# Patient Record
Sex: Female | Born: 1950
Health system: Southern US, Community
[De-identification: ages and names within clinical notes are randomized; demographics above are authoritative.]

## PROBLEM LIST (undated history)

## (undated) DIAGNOSIS — F259 Schizoaffective disorder, unspecified: Secondary | ICD-10-CM

## (undated) DIAGNOSIS — F22 Delusional disorders: Secondary | ICD-10-CM

## (undated) DIAGNOSIS — F29 Unspecified psychosis not due to a substance or known physiological condition: Secondary | ICD-10-CM

## (undated) DIAGNOSIS — Z9114 Patient's other noncompliance with medication regimen: Secondary | ICD-10-CM

## (undated) DIAGNOSIS — K219 Gastro-esophageal reflux disease without esophagitis: Secondary | ICD-10-CM

## (undated) DIAGNOSIS — I10 Essential (primary) hypertension: Secondary | ICD-10-CM

## (undated) DIAGNOSIS — F419 Anxiety disorder, unspecified: Secondary | ICD-10-CM

## (undated) DIAGNOSIS — F4310 Post-traumatic stress disorder, unspecified: Secondary | ICD-10-CM

## (undated) HISTORY — PX: ABDOMINAL SURGERY: SHX537

## (undated) HISTORY — PX: BLADDER SURGERY: SHX569

## (undated) HISTORY — PX: EYE SURGERY: SHX253

---

## 2013-12-29 DIAGNOSIS — F29 Unspecified psychosis not due to a substance or known physiological condition: Secondary | ICD-10-CM | POA: Diagnosis not present

## 2013-12-29 DIAGNOSIS — F3164 Bipolar disorder, current episode mixed, severe, with psychotic features: Secondary | ICD-10-CM | POA: Diagnosis present

## 2013-12-29 DIAGNOSIS — I10 Essential (primary) hypertension: Secondary | ICD-10-CM | POA: Diagnosis not present

## 2013-12-29 DIAGNOSIS — F312 Bipolar disorder, current episode manic severe with psychotic features: Secondary | ICD-10-CM | POA: Diagnosis not present

## 2013-12-29 DIAGNOSIS — F3160 Bipolar disorder, current episode mixed, unspecified: Secondary | ICD-10-CM | POA: Diagnosis not present

## 2013-12-29 DIAGNOSIS — F22 Delusional disorders: Secondary | ICD-10-CM | POA: Diagnosis not present

## 2014-01-05 DIAGNOSIS — F29 Unspecified psychosis not due to a substance or known physiological condition: Secondary | ICD-10-CM | POA: Diagnosis not present

## 2014-01-06 DIAGNOSIS — Z9119 Patient's noncompliance with other medical treatment and regimen: Secondary | ICD-10-CM | POA: Diagnosis not present

## 2014-01-06 DIAGNOSIS — R45851 Suicidal ideations: Secondary | ICD-10-CM | POA: Diagnosis not present

## 2014-01-06 DIAGNOSIS — K219 Gastro-esophageal reflux disease without esophagitis: Secondary | ICD-10-CM | POA: Diagnosis not present

## 2014-01-06 DIAGNOSIS — M129 Arthropathy, unspecified: Secondary | ICD-10-CM | POA: Diagnosis not present

## 2014-01-06 DIAGNOSIS — F23 Brief psychotic disorder: Secondary | ICD-10-CM | POA: Diagnosis not present

## 2014-01-06 DIAGNOSIS — R9431 Abnormal electrocardiogram [ECG] [EKG]: Secondary | ICD-10-CM | POA: Diagnosis not present

## 2014-01-06 DIAGNOSIS — Z91199 Patient's noncompliance with other medical treatment and regimen due to unspecified reason: Secondary | ICD-10-CM | POA: Diagnosis not present

## 2014-01-06 DIAGNOSIS — I10 Essential (primary) hypertension: Secondary | ICD-10-CM | POA: Diagnosis not present

## 2014-01-06 DIAGNOSIS — F4310 Post-traumatic stress disorder, unspecified: Secondary | ICD-10-CM | POA: Diagnosis not present

## 2014-01-06 DIAGNOSIS — F39 Unspecified mood [affective] disorder: Secondary | ICD-10-CM | POA: Diagnosis not present

## 2014-01-06 DIAGNOSIS — F259 Schizoaffective disorder, unspecified: Secondary | ICD-10-CM | POA: Diagnosis not present

## 2014-01-06 DIAGNOSIS — Z5900 Homelessness unspecified: Secondary | ICD-10-CM | POA: Diagnosis not present

## 2014-01-06 DIAGNOSIS — F29 Unspecified psychosis not due to a substance or known physiological condition: Secondary | ICD-10-CM | POA: Diagnosis not present

## 2014-01-06 DIAGNOSIS — Z8673 Personal history of transient ischemic attack (TIA), and cerebral infarction without residual deficits: Secondary | ICD-10-CM | POA: Diagnosis not present

## 2014-01-06 DIAGNOSIS — R410 Disorientation, unspecified: Secondary | ICD-10-CM | POA: Diagnosis not present

## 2014-01-20 DIAGNOSIS — IMO0002 Reserved for concepts with insufficient information to code with codable children: Secondary | ICD-10-CM | POA: Diagnosis not present

## 2014-01-20 DIAGNOSIS — R109 Unspecified abdominal pain: Secondary | ICD-10-CM | POA: Diagnosis not present

## 2014-01-20 DIAGNOSIS — I10 Essential (primary) hypertension: Secondary | ICD-10-CM | POA: Diagnosis not present

## 2014-01-20 DIAGNOSIS — M1710 Unilateral primary osteoarthritis, unspecified knee: Secondary | ICD-10-CM | POA: Diagnosis not present

## 2014-01-20 DIAGNOSIS — F29 Unspecified psychosis not due to a substance or known physiological condition: Secondary | ICD-10-CM | POA: Diagnosis not present

## 2014-01-20 DIAGNOSIS — Z23 Encounter for immunization: Secondary | ICD-10-CM | POA: Diagnosis not present

## 2014-02-18 DIAGNOSIS — T50901A Poisoning by unspecified drugs, medicaments and biological substances, accidental (unintentional), initial encounter: Secondary | ICD-10-CM | POA: Diagnosis not present

## 2014-02-18 DIAGNOSIS — I10 Essential (primary) hypertension: Secondary | ICD-10-CM | POA: Diagnosis not present

## 2014-02-18 DIAGNOSIS — R259 Unspecified abnormal involuntary movements: Secondary | ICD-10-CM | POA: Diagnosis not present

## 2014-02-18 DIAGNOSIS — R51 Headache: Secondary | ICD-10-CM | POA: Diagnosis not present

## 2014-02-21 DIAGNOSIS — I10 Essential (primary) hypertension: Secondary | ICD-10-CM | POA: Diagnosis not present

## 2014-02-21 DIAGNOSIS — Z8249 Family history of ischemic heart disease and other diseases of the circulatory system: Secondary | ICD-10-CM | POA: Diagnosis not present

## 2014-02-21 DIAGNOSIS — F4310 Post-traumatic stress disorder, unspecified: Secondary | ICD-10-CM | POA: Diagnosis not present

## 2014-02-21 DIAGNOSIS — I498 Other specified cardiac arrhythmias: Secondary | ICD-10-CM | POA: Diagnosis not present

## 2014-02-21 DIAGNOSIS — F411 Generalized anxiety disorder: Secondary | ICD-10-CM | POA: Diagnosis present

## 2014-02-21 DIAGNOSIS — R42 Dizziness and giddiness: Secondary | ICD-10-CM | POA: Diagnosis not present

## 2014-02-21 DIAGNOSIS — Z8673 Personal history of transient ischemic attack (TIA), and cerebral infarction without residual deficits: Secondary | ICD-10-CM | POA: Diagnosis not present

## 2014-02-21 DIAGNOSIS — I499 Cardiac arrhythmia, unspecified: Secondary | ICD-10-CM | POA: Diagnosis not present

## 2014-02-21 DIAGNOSIS — Z9119 Patient's noncompliance with other medical treatment and regimen: Secondary | ICD-10-CM | POA: Diagnosis not present

## 2014-02-21 DIAGNOSIS — F329 Major depressive disorder, single episode, unspecified: Secondary | ICD-10-CM | POA: Diagnosis not present

## 2014-02-21 DIAGNOSIS — R64 Cachexia: Secondary | ICD-10-CM | POA: Diagnosis not present

## 2014-02-21 DIAGNOSIS — F259 Schizoaffective disorder, unspecified: Secondary | ICD-10-CM | POA: Diagnosis not present

## 2014-02-21 DIAGNOSIS — Z598 Other problems related to housing and economic circumstances: Secondary | ICD-10-CM | POA: Diagnosis not present

## 2014-02-21 DIAGNOSIS — F3289 Other specified depressive episodes: Secondary | ICD-10-CM | POA: Diagnosis not present

## 2014-02-21 DIAGNOSIS — G4700 Insomnia, unspecified: Secondary | ICD-10-CM | POA: Diagnosis present

## 2014-02-21 DIAGNOSIS — Z91199 Patient's noncompliance with other medical treatment and regimen due to unspecified reason: Secondary | ICD-10-CM | POA: Diagnosis not present

## 2014-02-21 DIAGNOSIS — T45511A Poisoning by anticoagulants, accidental (unintentional), initial encounter: Secondary | ICD-10-CM | POA: Diagnosis not present

## 2014-02-21 DIAGNOSIS — G459 Transient cerebral ischemic attack, unspecified: Secondary | ICD-10-CM | POA: Diagnosis not present

## 2014-02-21 DIAGNOSIS — Z888 Allergy status to other drugs, medicaments and biological substances status: Secondary | ICD-10-CM | POA: Diagnosis not present

## 2014-02-21 DIAGNOSIS — Z5900 Homelessness unspecified: Secondary | ICD-10-CM | POA: Diagnosis not present

## 2014-02-21 DIAGNOSIS — T50901A Poisoning by unspecified drugs, medicaments and biological substances, accidental (unintentional), initial encounter: Secondary | ICD-10-CM | POA: Diagnosis not present

## 2014-02-21 DIAGNOSIS — Z7901 Long term (current) use of anticoagulants: Secondary | ICD-10-CM | POA: Diagnosis not present

## 2014-02-21 DIAGNOSIS — Z5987 Material hardship: Secondary | ICD-10-CM | POA: Diagnosis not present

## 2014-02-21 DIAGNOSIS — K219 Gastro-esophageal reflux disease without esophagitis: Secondary | ICD-10-CM | POA: Diagnosis present

## 2014-02-21 DIAGNOSIS — R209 Unspecified disturbances of skin sensation: Secondary | ICD-10-CM | POA: Diagnosis present

## 2014-02-21 DIAGNOSIS — IMO0002 Reserved for concepts with insufficient information to code with codable children: Secondary | ICD-10-CM | POA: Diagnosis not present

## 2014-02-21 DIAGNOSIS — IMO0001 Reserved for inherently not codable concepts without codable children: Secondary | ICD-10-CM | POA: Diagnosis not present

## 2014-03-15 DIAGNOSIS — E162 Hypoglycemia, unspecified: Secondary | ICD-10-CM | POA: Diagnosis not present

## 2014-03-15 DIAGNOSIS — F29 Unspecified psychosis not due to a substance or known physiological condition: Secondary | ICD-10-CM | POA: Diagnosis not present

## 2014-03-15 DIAGNOSIS — I10 Essential (primary) hypertension: Secondary | ICD-10-CM | POA: Diagnosis not present

## 2014-03-17 DIAGNOSIS — I10 Essential (primary) hypertension: Secondary | ICD-10-CM | POA: Diagnosis not present

## 2014-03-17 DIAGNOSIS — Z5320 Procedure and treatment not carried out because of patient's decision for unspecified reasons: Secondary | ICD-10-CM | POA: Diagnosis not present

## 2014-03-17 DIAGNOSIS — R0789 Other chest pain: Secondary | ICD-10-CM | POA: Diagnosis not present

## 2014-03-20 DIAGNOSIS — K5900 Constipation, unspecified: Secondary | ICD-10-CM | POA: Diagnosis not present

## 2014-03-22 DIAGNOSIS — R42 Dizziness and giddiness: Secondary | ICD-10-CM | POA: Diagnosis not present

## 2014-03-22 DIAGNOSIS — M1990 Unspecified osteoarthritis, unspecified site: Secondary | ICD-10-CM | POA: Diagnosis not present

## 2014-03-22 DIAGNOSIS — F4310 Post-traumatic stress disorder, unspecified: Secondary | ICD-10-CM | POA: Diagnosis not present

## 2014-03-22 DIAGNOSIS — R5381 Other malaise: Secondary | ICD-10-CM | POA: Diagnosis not present

## 2014-03-22 DIAGNOSIS — E162 Hypoglycemia, unspecified: Secondary | ICD-10-CM | POA: Diagnosis not present

## 2014-03-22 DIAGNOSIS — R5383 Other fatigue: Secondary | ICD-10-CM | POA: Diagnosis not present

## 2014-03-22 DIAGNOSIS — Z5900 Homelessness unspecified: Secondary | ICD-10-CM | POA: Diagnosis not present

## 2014-03-22 DIAGNOSIS — Z01818 Encounter for other preprocedural examination: Secondary | ICD-10-CM | POA: Diagnosis not present

## 2014-03-22 DIAGNOSIS — F29 Unspecified psychosis not due to a substance or known physiological condition: Secondary | ICD-10-CM | POA: Diagnosis not present

## 2014-03-22 DIAGNOSIS — R4182 Altered mental status, unspecified: Secondary | ICD-10-CM | POA: Diagnosis not present

## 2014-03-23 DIAGNOSIS — E162 Hypoglycemia, unspecified: Secondary | ICD-10-CM | POA: Diagnosis not present

## 2014-03-28 DIAGNOSIS — R4181 Age-related cognitive decline: Secondary | ICD-10-CM | POA: Diagnosis not present

## 2014-03-28 DIAGNOSIS — F259 Schizoaffective disorder, unspecified: Secondary | ICD-10-CM | POA: Diagnosis not present

## 2014-03-28 DIAGNOSIS — G3184 Mild cognitive impairment, so stated: Secondary | ICD-10-CM | POA: Diagnosis not present

## 2014-04-05 DIAGNOSIS — H5200 Hypermetropia, unspecified eye: Secondary | ICD-10-CM | POA: Diagnosis not present

## 2014-04-05 DIAGNOSIS — H25019 Cortical age-related cataract, unspecified eye: Secondary | ICD-10-CM | POA: Diagnosis not present

## 2014-04-05 DIAGNOSIS — I10 Essential (primary) hypertension: Secondary | ICD-10-CM | POA: Diagnosis not present

## 2014-04-05 DIAGNOSIS — Z01818 Encounter for other preprocedural examination: Secondary | ICD-10-CM | POA: Diagnosis not present

## 2014-04-05 DIAGNOSIS — H524 Presbyopia: Secondary | ICD-10-CM | POA: Diagnosis not present

## 2014-04-05 DIAGNOSIS — K219 Gastro-esophageal reflux disease without esophagitis: Secondary | ICD-10-CM | POA: Diagnosis not present

## 2014-04-05 DIAGNOSIS — H2510 Age-related nuclear cataract, unspecified eye: Secondary | ICD-10-CM | POA: Diagnosis not present

## 2014-04-29 DIAGNOSIS — Z8719 Personal history of other diseases of the digestive system: Secondary | ICD-10-CM | POA: Diagnosis not present

## 2014-04-29 DIAGNOSIS — R109 Unspecified abdominal pain: Secondary | ICD-10-CM | POA: Diagnosis not present

## 2014-04-29 DIAGNOSIS — K5900 Constipation, unspecified: Secondary | ICD-10-CM | POA: Diagnosis not present

## 2014-05-19 DIAGNOSIS — H269 Unspecified cataract: Secondary | ICD-10-CM | POA: Diagnosis not present

## 2014-05-26 DIAGNOSIS — H269 Unspecified cataract: Secondary | ICD-10-CM | POA: Diagnosis not present

## 2014-05-26 DIAGNOSIS — H25019 Cortical age-related cataract, unspecified eye: Secondary | ICD-10-CM | POA: Diagnosis not present

## 2014-05-26 DIAGNOSIS — H2510 Age-related nuclear cataract, unspecified eye: Secondary | ICD-10-CM | POA: Diagnosis not present

## 2014-07-20 DIAGNOSIS — F22 Delusional disorders: Secondary | ICD-10-CM | POA: Diagnosis not present

## 2014-07-20 DIAGNOSIS — Z888 Allergy status to other drugs, medicaments and biological substances status: Secondary | ICD-10-CM | POA: Diagnosis not present

## 2014-07-20 DIAGNOSIS — F489 Nonpsychotic mental disorder, unspecified: Secondary | ICD-10-CM | POA: Diagnosis not present

## 2014-07-20 DIAGNOSIS — Z5900 Homelessness unspecified: Secondary | ICD-10-CM | POA: Diagnosis not present

## 2014-07-20 DIAGNOSIS — R259 Unspecified abnormal involuntary movements: Secondary | ICD-10-CM | POA: Diagnosis not present

## 2014-07-20 DIAGNOSIS — R4789 Other speech disturbances: Secondary | ICD-10-CM | POA: Diagnosis not present

## 2014-07-20 DIAGNOSIS — G8929 Other chronic pain: Secondary | ICD-10-CM | POA: Diagnosis not present

## 2014-07-20 DIAGNOSIS — E860 Dehydration: Secondary | ICD-10-CM | POA: Diagnosis not present

## 2014-07-20 DIAGNOSIS — R5381 Other malaise: Secondary | ICD-10-CM | POA: Diagnosis not present

## 2014-07-20 DIAGNOSIS — F39 Unspecified mood [affective] disorder: Secondary | ICD-10-CM | POA: Diagnosis not present

## 2014-07-20 DIAGNOSIS — R05 Cough: Secondary | ICD-10-CM | POA: Diagnosis not present

## 2014-07-20 DIAGNOSIS — F411 Generalized anxiety disorder: Secondary | ICD-10-CM | POA: Diagnosis not present

## 2014-07-20 DIAGNOSIS — E870 Hyperosmolality and hypernatremia: Secondary | ICD-10-CM | POA: Diagnosis not present

## 2014-07-20 DIAGNOSIS — R4182 Altered mental status, unspecified: Secondary | ICD-10-CM | POA: Diagnosis not present

## 2014-07-20 DIAGNOSIS — R059 Cough, unspecified: Secondary | ICD-10-CM | POA: Diagnosis not present

## 2014-07-20 DIAGNOSIS — F3289 Other specified depressive episodes: Secondary | ICD-10-CM | POA: Diagnosis present

## 2014-07-20 DIAGNOSIS — I10 Essential (primary) hypertension: Secondary | ICD-10-CM | POA: Diagnosis not present

## 2014-07-20 DIAGNOSIS — F329 Major depressive disorder, single episode, unspecified: Secondary | ICD-10-CM | POA: Diagnosis present

## 2014-07-20 DIAGNOSIS — I658 Occlusion and stenosis of other precerebral arteries: Secondary | ICD-10-CM | POA: Diagnosis not present

## 2014-07-20 DIAGNOSIS — F4310 Post-traumatic stress disorder, unspecified: Secondary | ICD-10-CM | POA: Diagnosis not present

## 2014-07-20 DIAGNOSIS — R5383 Other fatigue: Secondary | ICD-10-CM | POA: Diagnosis not present

## 2014-08-09 DIAGNOSIS — M1710 Unilateral primary osteoarthritis, unspecified knee: Secondary | ICD-10-CM | POA: Diagnosis not present

## 2014-08-09 DIAGNOSIS — K219 Gastro-esophageal reflux disease without esophagitis: Secondary | ICD-10-CM | POA: Diagnosis not present

## 2014-08-09 DIAGNOSIS — F411 Generalized anxiety disorder: Secondary | ICD-10-CM | POA: Diagnosis not present

## 2014-08-09 DIAGNOSIS — IMO0002 Reserved for concepts with insufficient information to code with codable children: Secondary | ICD-10-CM | POA: Diagnosis not present

## 2014-08-09 DIAGNOSIS — I10 Essential (primary) hypertension: Secondary | ICD-10-CM | POA: Diagnosis not present

## 2014-08-09 DIAGNOSIS — R82998 Other abnormal findings in urine: Secondary | ICD-10-CM | POA: Diagnosis not present

## 2014-08-13 DIAGNOSIS — E162 Hypoglycemia, unspecified: Secondary | ICD-10-CM | POA: Diagnosis not present

## 2014-08-13 DIAGNOSIS — R079 Chest pain, unspecified: Secondary | ICD-10-CM | POA: Diagnosis not present

## 2014-08-13 DIAGNOSIS — R071 Chest pain on breathing: Secondary | ICD-10-CM | POA: Diagnosis not present

## 2014-08-13 DIAGNOSIS — I10 Essential (primary) hypertension: Secondary | ICD-10-CM | POA: Diagnosis not present

## 2014-08-21 DIAGNOSIS — R4182 Altered mental status, unspecified: Secondary | ICD-10-CM | POA: Diagnosis not present

## 2014-08-21 DIAGNOSIS — F4310 Post-traumatic stress disorder, unspecified: Secondary | ICD-10-CM | POA: Diagnosis not present

## 2014-08-21 DIAGNOSIS — F259 Schizoaffective disorder, unspecified: Secondary | ICD-10-CM | POA: Diagnosis not present

## 2014-08-22 DIAGNOSIS — M1710 Unilateral primary osteoarthritis, unspecified knee: Secondary | ICD-10-CM | POA: Diagnosis not present

## 2014-08-22 DIAGNOSIS — F259 Schizoaffective disorder, unspecified: Secondary | ICD-10-CM | POA: Diagnosis not present

## 2014-08-22 DIAGNOSIS — K219 Gastro-esophageal reflux disease without esophagitis: Secondary | ICD-10-CM | POA: Diagnosis not present

## 2014-08-22 DIAGNOSIS — I10 Essential (primary) hypertension: Secondary | ICD-10-CM | POA: Diagnosis not present

## 2014-08-23 DIAGNOSIS — F259 Schizoaffective disorder, unspecified: Secondary | ICD-10-CM | POA: Diagnosis not present

## 2014-08-31 DIAGNOSIS — F259 Schizoaffective disorder, unspecified: Secondary | ICD-10-CM | POA: Diagnosis not present

## 2014-09-01 DIAGNOSIS — F259 Schizoaffective disorder, unspecified: Secondary | ICD-10-CM | POA: Diagnosis not present

## 2014-09-02 DIAGNOSIS — F259 Schizoaffective disorder, unspecified: Secondary | ICD-10-CM | POA: Diagnosis not present

## 2014-09-06 DIAGNOSIS — F259 Schizoaffective disorder, unspecified: Secondary | ICD-10-CM | POA: Diagnosis not present

## 2014-09-12 DIAGNOSIS — F259 Schizoaffective disorder, unspecified: Secondary | ICD-10-CM | POA: Diagnosis not present

## 2014-09-22 DIAGNOSIS — F259 Schizoaffective disorder, unspecified: Secondary | ICD-10-CM | POA: Diagnosis not present

## 2014-09-25 DIAGNOSIS — I10 Essential (primary) hypertension: Secondary | ICD-10-CM | POA: Diagnosis not present

## 2014-09-25 DIAGNOSIS — F259 Schizoaffective disorder, unspecified: Secondary | ICD-10-CM | POA: Diagnosis not present

## 2014-09-25 DIAGNOSIS — K219 Gastro-esophageal reflux disease without esophagitis: Secondary | ICD-10-CM | POA: Diagnosis not present

## 2014-12-05 ENCOUNTER — Emergency Department (HOSPITAL_COMMUNITY)
Admission: EM | Admit: 2014-12-05 | Discharge: 2014-12-05 | Disposition: A | Discharge status: Home / Self Care | Payer: Medicare Other | Attending: Emergency Medicine | Admitting: Emergency Medicine

## 2014-12-05 ENCOUNTER — Encounter (HOSPITAL_COMMUNITY): Payer: Self-pay | Admitting: *Deleted

## 2014-12-05 ENCOUNTER — Emergency Department (HOSPITAL_COMMUNITY): Payer: Medicare Other

## 2014-12-05 DIAGNOSIS — R0789 Other chest pain: Secondary | ICD-10-CM | POA: Diagnosis not present

## 2014-12-05 DIAGNOSIS — Z79899 Other long term (current) drug therapy: Secondary | ICD-10-CM | POA: Insufficient documentation

## 2014-12-05 DIAGNOSIS — R918 Other nonspecific abnormal finding of lung field: Secondary | ICD-10-CM | POA: Diagnosis not present

## 2014-12-05 DIAGNOSIS — Z87891 Personal history of nicotine dependence: Secondary | ICD-10-CM | POA: Insufficient documentation

## 2014-12-05 DIAGNOSIS — R079 Chest pain, unspecified: Secondary | ICD-10-CM | POA: Diagnosis present

## 2014-12-05 DIAGNOSIS — R0602 Shortness of breath: Secondary | ICD-10-CM | POA: Diagnosis not present

## 2014-12-05 DIAGNOSIS — I10 Essential (primary) hypertension: Secondary | ICD-10-CM | POA: Insufficient documentation

## 2014-12-05 DIAGNOSIS — R05 Cough: Secondary | ICD-10-CM | POA: Insufficient documentation

## 2014-12-05 DIAGNOSIS — R52 Pain, unspecified: Secondary | ICD-10-CM

## 2014-12-05 DIAGNOSIS — R059 Cough, unspecified: Secondary | ICD-10-CM

## 2014-12-05 HISTORY — DX: Essential (primary) hypertension: I10

## 2014-12-05 LAB — COMPREHENSIVE METABOLIC PANEL
ALT: 10 U/L (ref 0–35)
AST: 15 U/L (ref 0–37)
Albumin: 3.6 g/dL (ref 3.5–5.2)
Alkaline Phosphatase: 110 U/L (ref 39–117)
Anion gap: 13 (ref 5–15)
BILIRUBIN TOTAL: 0.3 mg/dL (ref 0.3–1.2)
BUN: 21 mg/dL (ref 6–23)
CALCIUM: 9.3 mg/dL (ref 8.4–10.5)
CHLORIDE: 100 meq/L (ref 96–112)
CO2: 24 meq/L (ref 19–32)
CREATININE: 0.89 mg/dL (ref 0.50–1.10)
GFR, EST AFRICAN AMERICAN: 79 mL/min — AB (ref >90)
GFR, EST NON AFRICAN AMERICAN: 68 mL/min — AB (ref >90)
GLUCOSE: 98 mg/dL (ref 70–99)
Potassium: 3.7 mEq/L (ref 3.7–5.3)
Sodium: 137 mEq/L (ref 137–147)
Total Protein: 6.9 g/dL (ref 6.0–8.3)

## 2014-12-05 LAB — CBC WITH DIFFERENTIAL/PLATELET
Basophils Absolute: 0 10*3/uL (ref 0.0–0.1)
Basophils Relative: 1 % (ref 0–1)
EOS PCT: 3 % (ref 0–5)
Eosinophils Absolute: 0.2 10*3/uL (ref 0.0–0.7)
HCT: 37.2 % (ref 36.0–46.0)
Hemoglobin: 12.2 g/dL (ref 12.0–15.0)
LYMPHS PCT: 37 % (ref 12–46)
Lymphs Abs: 2.2 10*3/uL (ref 0.7–4.0)
MCH: 26.9 pg (ref 26.0–34.0)
MCHC: 32.8 g/dL (ref 30.0–36.0)
MCV: 81.9 fL (ref 78.0–100.0)
MONO ABS: 0.6 10*3/uL (ref 0.1–1.0)
MONOS PCT: 10 % (ref 3–12)
Neutro Abs: 3 10*3/uL (ref 1.7–7.7)
Neutrophils Relative %: 49 % (ref 43–77)
Platelets: 279 10*3/uL (ref 150–400)
RBC: 4.54 MIL/uL (ref 3.87–5.11)
RDW: 13.8 % (ref 11.5–15.5)
WBC: 6.1 10*3/uL (ref 4.0–10.5)

## 2014-12-05 LAB — D-DIMER, QUANTITATIVE: D-Dimer, Quant: <0.27 ug/mL-FEU (ref 0.00–0.48)

## 2014-12-05 LAB — I-STAT TROPONIN, ED: TROPONIN I, POC: 0 ng/mL (ref 0.00–0.08)

## 2014-12-05 LAB — LIPASE, BLOOD: Lipase: 53 U/L (ref 11–59)

## 2014-12-05 MED ORDER — LORAZEPAM 2 MG/ML IJ SOLN
0.5000 mg | Freq: Once | INTRAMUSCULAR | Status: AC
  Administered 2014-12-05: 0.5 mg via INTRAVENOUS
  Filled 2014-12-05: qty 1

## 2014-12-05 MED ORDER — MORPHINE SULFATE 4 MG/ML IJ SOLN
4.0000 mg | Freq: Once | INTRAMUSCULAR | Status: AC
  Administered 2014-12-05: 4 mg via INTRAVENOUS
  Filled 2014-12-05: qty 1

## 2014-12-05 MED ORDER — NITROGLYCERIN 0.4 MG SL SUBL
0.4000 mg | SUBLINGUAL_TABLET | Freq: Once | SUBLINGUAL | Status: AC
  Administered 2014-12-05: 0.4 mg via SUBLINGUAL
  Filled 2014-12-05: qty 1

## 2014-12-05 MED ORDER — HYDROCODONE-ACETAMINOPHEN 5-325 MG PO TABS: 1.0000 | ORAL_TABLET | Freq: Four times a day (QID) | ORAL | Status: DC | PRN

## 2014-12-05 NOTE — ED Provider Notes (Signed)
CSN: [id number]     Arrival date & time 12/05/14  2113 History   First MD Initiated Contact with Patient 12/05/14 2134     Chief Complaint  Patient presents with  . Chest Pain     (Consider location/radiation/quality/duration/timing/severity/associated sxs/prior Treatment) Patient is a 63 y.o. female presenting with chest pain. The history is provided by the patient (the pt states she has had chest pain all day today.   the pain has been continuous).  Chest Pain Pain location:  Substernal area Pain quality: aching   Pain radiates to:  L arm Pain radiates to the back: no   Pain severity:  Moderate Onset quality:  Sudden Timing:  Constant Chronicity:  New Context: not breathing   Associated symptoms: no abdominal pain, no back pain, no cough, no fatigue and no headache     Past Medical History  Diagnosis Date  . Hypertension   . Hypoglycemia    Past Surgical History  Procedure Laterality Date  . Eye surgery    . Abdominal surgery      blockage  . Bladder surgery     No family history on file. History  Substance Use Topics  . Smoking status: Former Games developermoker  . Smokeless tobacco: Not on file  . Alcohol Use: No   OB History    No data available     Review of Systems  Constitutional: Negative for appetite change and fatigue.  HENT: Negative for congestion, ear discharge and sinus pressure.   Eyes: Negative for discharge.  Respiratory: Negative for cough.   Cardiovascular: Positive for chest pain.  Gastrointestinal: Negative for abdominal pain and diarrhea.  Genitourinary: Negative for frequency and hematuria.  Musculoskeletal: Negative for back pain.  Skin: Negative for rash.  Neurological: Negative for seizures and headaches.  Psychiatric/Behavioral: Negative for hallucinations.      Allergies  Benadryl; Haldol; Red dye; Yeast-related products; and Yellow dyes (non-tartrazine)  Home Medications   Prior to Admission medications   Medication Sig Start Date  End Date Taking? Authorizing Provider  acetaminophen (TYLENOL) 500 MG tablet Take 1,000 mg by mouth every 6 (six) hours as needed for headache (headache).   Yes Historical Provider, MD  aspirin-acetaminophen-caffeine (EXCEDRIN MIGRAINE) [id number] MG per tablet Take 1-2 tablets by mouth every 6 (six) hours as needed for headache (headache).   Yes Historical Provider, MD  Famotidine-Ca Carb-Mag Hydrox (ACID REDUCER + ANTACID PO) Take 1 capsule by mouth daily.   Yes Historical Provider, MD  HYDROcodone-acetaminophen (NORCO/VICODIN) 5-325 MG per tablet Take 1 tablet by mouth every 6 (six) hours as needed for moderate pain. 12/05/14   Benny LennertJoseph L Burrel Legrand, MD   BP 118/73 mmHg  Pulse 58  Temp(Src) 97.5 F (36.4 C) (Oral)  Resp 13  Ht 5\' 4"  (1.626 m)  Wt 120 lb (54.432 kg)  BMI 20.59 kg/m2  SpO2 93% Physical Exam  Constitutional: She is oriented to person, place, and time. She appears well-developed.  HENT:  Head: Normocephalic.  Eyes: Conjunctivae and EOM are normal. No scleral icterus.  Neck: Neck supple. No thyromegaly present.  Cardiovascular: Normal rate and regular rhythm.  Exam reveals no gallop and no friction rub.   No murmur heard. Pulmonary/Chest: No stridor. She has no wheezes. She has no rales. She exhibits no tenderness.  Abdominal: She exhibits no distension. There is no tenderness. There is no rebound.  Musculoskeletal: Normal range of motion. She exhibits no edema.  Lymphadenopathy:    She has no cervical adenopathy.  Neurological: She  is oriented to person, place, and time. She exhibits normal muscle tone. Coordination normal.  Skin: No rash noted. No erythema.  Psychiatric: She has a normal mood and affect. Her behavior is normal.    ED Course  Procedures (including critical care time) Labs Review Labs Reviewed  COMPREHENSIVE METABOLIC PANEL - Abnormal; Notable for the following:    GFR calc non Af Amer 68 (*)    GFR calc Af Amer 79 (*)    All other components within  normal limits  CBC WITH DIFFERENTIAL  LIPASE, BLOOD  D-DIMER, QUANTITATIVE  I-STAT TROPOININ, ED    Imaging Review Dg Chest 2 View  12/05/2014   CLINICAL DATA:  Abnormal portable chest radiograph question nipple shadow  EXAM: CHEST  2 VIEW  COMPARISON:  12/05/2014  FINDINGS: Nodular density at the RIGHT lung base on previous exam is no longer identified.  Is made represented nipple shadow.  On current upright PA view the nipple markers are projecting below the diaphragm.  Lungs remain clear.  Heart size normal.  IMPRESSION: Nipple shadow on previous exam without evidence of pulmonary nodule.  No acute abnormalities.   Electronically Signed   By: Ulyses SouthwardMark  Boles M.D.   On: 12/05/2014 22:59   Dg Chest Portable 1 View  12/05/2014   CLINICAL DATA:  Chest pain radiating to LEFT shoulder and arm for 24 hr, nausea, shortness of breath, history hypertension, former smoker, initial encounter  EXAM: PORTABLE CHEST - 1 VIEW  COMPARISON:  Portable exam 2158 hr without priors for comparison  FINDINGS: Numerous EKG leads project over chest.  Normal heart size, mediastinal contours, and pulmonary vascularity.  Lungs clear.  No pleural effusion or pneumothorax.  Questionable nodular density at lower lateral RIGHT chest versus nipple shadow.  No acute osseous findings.  IMPRESSION: Question RIGHT nipple shadow.  Followup upright PA chest radiograph with nipple markers recommended to exclude pulmonary nodule.   Electronically Signed   By: Ulyses SouthwardMark  Boles M.D.   On: 12/05/2014 22:14     EKG Interpretation None     CRITICAL CARE  Date: 12/05/2014  Rate: 65  Rhythm: normal sinus rhythm  QRS Axis: normal  Intervals: normal  ST/T Wave abnormalities: normal  Conduction Disutrbances:none  Narrative Interpretation:   Old EKG Reviewed: none available     MDM   Final diagnoses:  Pain  Cough  Chest wall pain    Chest pain with normal studies.   Pt improved with tx will have pt follow up with pcp or [place] card        Benny LennertJoseph L Kiwana Deblasi, MD 12/05/14 2325

## 2014-12-05 NOTE — ED Notes (Signed)
Pt states that she began having left arm and left shoulder pain last night; pt states that it has progressed to chest pain and shortness of breath, nausea and diaphoresis

## 2014-12-05 NOTE — Discharge Instructions (Signed)
Follow up with your family md or [facility] next week

## 2015-01-08 ENCOUNTER — Emergency Department (HOSPITAL_COMMUNITY)
Admission: EM | Admit: 2015-01-08 | Discharge: 2015-01-08 | Discharge status: Against Medical Advice | Payer: Medicare Other | Attending: Emergency Medicine | Admitting: Emergency Medicine

## 2015-01-08 ENCOUNTER — Encounter (HOSPITAL_COMMUNITY): Payer: Self-pay | Admitting: Emergency Medicine

## 2015-01-08 DIAGNOSIS — Z87891 Personal history of nicotine dependence: Secondary | ICD-10-CM | POA: Diagnosis not present

## 2015-01-08 DIAGNOSIS — M25551 Pain in right hip: Secondary | ICD-10-CM | POA: Insufficient documentation

## 2015-01-08 DIAGNOSIS — I10 Essential (primary) hypertension: Secondary | ICD-10-CM | POA: Diagnosis not present

## 2015-01-08 NOTE — ED Notes (Signed)
Pt not in waiting room, called for patient 4 separate times over a period of 1 hour. Pt presumed to have left ED.

## 2015-01-08 NOTE — ED Notes (Signed)
Pt c/o right hip pain onset Thursday, no injury that she knows of. Pt here with other patient by that last name.

## 2015-01-13 ENCOUNTER — Emergency Department (HOSPITAL_COMMUNITY)
Admission: EM | Admit: 2015-01-13 | Discharge: 2015-01-13 | Disposition: A | Discharge status: Home / Self Care | Payer: Medicare Other | Attending: Emergency Medicine | Admitting: Emergency Medicine

## 2015-01-13 ENCOUNTER — Emergency Department (HOSPITAL_COMMUNITY): Payer: Medicare Other

## 2015-01-13 ENCOUNTER — Encounter (HOSPITAL_COMMUNITY): Payer: Self-pay | Admitting: Emergency Medicine

## 2015-01-13 DIAGNOSIS — S76011A Strain of muscle, fascia and tendon of right hip, initial encounter: Secondary | ICD-10-CM | POA: Insufficient documentation

## 2015-01-13 DIAGNOSIS — Z87891 Personal history of nicotine dependence: Secondary | ICD-10-CM | POA: Insufficient documentation

## 2015-01-13 DIAGNOSIS — Y998 Other external cause status: Secondary | ICD-10-CM | POA: Diagnosis not present

## 2015-01-13 DIAGNOSIS — Y9389 Activity, other specified: Secondary | ICD-10-CM | POA: Insufficient documentation

## 2015-01-13 DIAGNOSIS — Y92410 Unspecified street and highway as the place of occurrence of the external cause: Secondary | ICD-10-CM | POA: Insufficient documentation

## 2015-01-13 DIAGNOSIS — R102 Pelvic and perineal pain: Secondary | ICD-10-CM

## 2015-01-13 DIAGNOSIS — S39012A Strain of muscle, fascia and tendon of lower back, initial encounter: Secondary | ICD-10-CM | POA: Diagnosis not present

## 2015-01-13 DIAGNOSIS — S3992XA Unspecified injury of lower back, initial encounter: Secondary | ICD-10-CM | POA: Diagnosis not present

## 2015-01-13 DIAGNOSIS — S3993XA Unspecified injury of pelvis, initial encounter: Secondary | ICD-10-CM | POA: Insufficient documentation

## 2015-01-13 DIAGNOSIS — I10 Essential (primary) hypertension: Secondary | ICD-10-CM | POA: Diagnosis not present

## 2015-01-13 DIAGNOSIS — S86912A Strain of unspecified muscle(s) and tendon(s) at lower leg level, left leg, initial encounter: Secondary | ICD-10-CM

## 2015-01-13 DIAGNOSIS — S79911A Unspecified injury of right hip, initial encounter: Secondary | ICD-10-CM | POA: Diagnosis present

## 2015-01-13 NOTE — ED Notes (Signed)
Pt demanding to stay overnight in the lobby.  Pt explained that I would be glad to get some resources and try to find a ride there via bus pass.  Pt states that she and her significant other keep coming to the hospital because no one will let them stay.

## 2015-01-13 NOTE — ED Provider Notes (Signed)
CSN: [id number]     Arrival date & time 01/13/15  2146 History  This chart was scribed for non-physician practitioner working with No att. providers found, by Jarvis Morganaylor Ferguson, ED Scribe. This patient was seen in room WTR8/WTR8 and the patient's care was started at 10:08 PM.    Chief Complaint  Patient presents with  . Hip Pain    Patient is a 64 y.o. female presenting with hip pain. The history is provided by the patient. No language interpreter was used.  Hip Pain This is a new problem. Episode onset: 1 week. The problem occurs rarely. The problem has not changed since onset.Pertinent negatives include no abdominal pain. The symptoms are aggravated by exertion and walking. Nothing relieves the symptoms. She has tried nothing for the symptoms.    HPI Comments: Jennifer Leon is a 64 y.o. female with a h/o HTN and hypoglycemia who presents to the Emergency Department complaining of constant, moderate, right hip pain for 1 week. She states that 1 week ago she was on the bus and she was walking and felt like her right hip "gave out". She denies any fall. Pt notes that she recently found out that her legs are uneven. She notes that the pain is exacerbated by ambulation. She denies any abdominal pain or gait issue.   Past Medical History  Diagnosis Date  . Hypertension   . Hypoglycemia    Past Surgical History  Procedure Laterality Date  . Eye surgery    . Abdominal surgery      blockage  . Bladder surgery     No family history on file. History  Substance Use Topics  . Smoking status: Former Games developermoker  . Smokeless tobacco: Not on file  . Alcohol Use: No   OB History    No data available     Review of Systems  Gastrointestinal: Negative for abdominal pain.  Musculoskeletal: Positive for arthralgias (right hip). Negative for gait problem.      Allergies  Benadryl; Haldol; Red dye; Yeast-related products; and Yellow dyes (non-tartrazine)  Home Medications   Prior to Admission  medications   Medication Sig Start Date End Date Taking? Authorizing Provider  aspirin-acetaminophen-caffeine (EXCEDRIN MIGRAINE) [phone] MG per tablet Take 1-2 tablets by mouth every 6 (six) hours as needed for headache (headache).   Yes Historical Provider, MD  bismuth subsalicylate (PEPTO BISMOL) 262 MG/15ML suspension Take 30 mLs by mouth every 6 (six) hours as needed for indigestion.   Yes Historical Provider, MD  carboxymethylcellulose (REFRESH PLUS) 0.5 % SOLN Place 1 drop into both eyes 3 (three) times daily as needed.   Yes Historical Provider, MD  HYDROcodone-acetaminophen (NORCO/VICODIN) 5-325 MG per tablet Take 1 tablet by mouth every 6 (six) hours as needed for moderate pain. 12/05/14  Yes Benny LennertJoseph L Zammit, MD   Triage Vitals: BP 127/86 mmHg  Pulse 70  Temp(Src) 98.3 F (36.8 C) (Oral)  Resp 16  SpO2 100%  Physical Exam  Constitutional: She is oriented to person, place, and time. She appears well-developed and well-nourished. No distress.  HENT:  Head: Normocephalic and atraumatic.  Eyes: Conjunctivae and EOM are normal.  Neck: Neck supple. No tracheal deviation present.  Cardiovascular: Normal rate.   Pulmonary/Chest: Effort normal. No respiratory distress.  Musculoskeletal: Normal range of motion.  Minimal right paralumbar tenderness. Right hip full ROM w/o discomfort. Full strength of lower extremities. Mildly tender at anteromedial inguinal fold without swelling   Neurological: She is alert and oriented to person, place, and  time.  Skin: Skin is warm and dry.  Psychiatric: She has a normal mood and affect. Her behavior is normal.  Nursing note and vitals reviewed.   ED Course  Procedures (including critical care time)  DIAGNOSTIC STUDIES: Oxygen Saturation is 100% on RA, normal by my interpretation.    COORDINATION OF CARE: 10:24 PM- Will order x-ray of right hip. Pt advised of plan for treatment and pt agrees.     Labs Review Labs Reviewed - No data to  display  Imaging Review Dg Hip Unilat With Pelvis 2-3 Views Right  01/13/2015   CLINICAL DATA:  Constant moderate RIGHT pain for 1 week, air RIGHT hip gave out ; worse with ambulation.  EXAM: DG HIP W/ PELVIS 2-3V*R*  COMPARISON:  None.  FINDINGS: No acute fracture deformity nor dislocation. Sclerotic line at the RIGHT femoral head neck junction. No destructive bony lesions. Joint spaces intact. Subcentimeter possible loose body RIGHT femoral neck. Moderate degenerative sacroiliac joints. Soft tissue planes are nonsuspicious.  IMPRESSION: No acute fracture deformity or dislocation. No advanced degenerative change for age.  Sclerotic line at RIGHT femoral head neck junction could reflect stress injury.   Electronically Signed   By: Awilda Metro   On: 01/13/2015 23:31     EKG Interpretation None      MDM   Final diagnoses:  None    1. Right hip pain.  The patient is ambulatory, fully weight bearing. Suspect sprain/strain to hip/groin. Suggest ibuprofen, rest.   I personally performed the services described in this documentation, which was scribed in my presence. The recorded information has been reviewed and is accurate.     Arnoldo Hooker, PA-C 01/18/15 1610  Doug Sou, MD 01/18/15 1616

## 2015-01-13 NOTE — ED Notes (Signed)
Pt states her R hip "gives out" while she is ambulating and almost makes her fall. States she is able to catch herself. States she recently found out that her legs are uneven. Symptoms x one week. Pt ambulatory in triage with steady independent gait.

## 2015-01-13 NOTE — Discharge Instructions (Signed)

## 2015-05-10 DIAGNOSIS — F209 Schizophrenia, unspecified: Secondary | ICD-10-CM | POA: Diagnosis not present

## 2015-05-10 DIAGNOSIS — S0181XA Laceration without foreign body of other part of head, initial encounter: Secondary | ICD-10-CM | POA: Diagnosis not present

## 2015-05-10 DIAGNOSIS — F259 Schizoaffective disorder, unspecified: Secondary | ICD-10-CM | POA: Diagnosis not present

## 2015-07-12 DIAGNOSIS — M19042 Primary osteoarthritis, left hand: Secondary | ICD-10-CM | POA: Diagnosis not present

## 2015-07-12 DIAGNOSIS — M19041 Primary osteoarthritis, right hand: Secondary | ICD-10-CM | POA: Diagnosis not present

## 2015-07-12 DIAGNOSIS — I10 Essential (primary) hypertension: Secondary | ICD-10-CM | POA: Diagnosis not present

## 2015-07-12 DIAGNOSIS — F250 Schizoaffective disorder, bipolar type: Secondary | ICD-10-CM | POA: Diagnosis not present

## 2015-07-12 DIAGNOSIS — K219 Gastro-esophageal reflux disease without esophagitis: Secondary | ICD-10-CM | POA: Diagnosis not present

## 2015-07-12 DIAGNOSIS — M259 Joint disorder, unspecified: Secondary | ICD-10-CM | POA: Diagnosis not present

## 2015-07-17 DIAGNOSIS — F250 Schizoaffective disorder, bipolar type: Secondary | ICD-10-CM | POA: Diagnosis not present

## 2015-07-27 DIAGNOSIS — F250 Schizoaffective disorder, bipolar type: Secondary | ICD-10-CM | POA: Diagnosis not present

## 2015-07-31 DIAGNOSIS — F250 Schizoaffective disorder, bipolar type: Secondary | ICD-10-CM | POA: Diagnosis not present

## 2015-08-05 DIAGNOSIS — F250 Schizoaffective disorder, bipolar type: Secondary | ICD-10-CM | POA: Diagnosis not present

## 2015-08-06 DIAGNOSIS — F250 Schizoaffective disorder, bipolar type: Secondary | ICD-10-CM | POA: Diagnosis not present

## 2015-08-07 DIAGNOSIS — F250 Schizoaffective disorder, bipolar type: Secondary | ICD-10-CM | POA: Diagnosis not present

## 2015-08-13 DIAGNOSIS — F250 Schizoaffective disorder, bipolar type: Secondary | ICD-10-CM | POA: Diagnosis not present

## 2015-08-14 DIAGNOSIS — F250 Schizoaffective disorder, bipolar type: Secondary | ICD-10-CM | POA: Diagnosis not present

## 2015-08-15 DIAGNOSIS — F250 Schizoaffective disorder, bipolar type: Secondary | ICD-10-CM | POA: Diagnosis not present

## 2015-08-16 DIAGNOSIS — F250 Schizoaffective disorder, bipolar type: Secondary | ICD-10-CM | POA: Diagnosis not present

## 2015-08-24 DIAGNOSIS — F250 Schizoaffective disorder, bipolar type: Secondary | ICD-10-CM | POA: Diagnosis not present

## 2015-08-27 DIAGNOSIS — I10 Essential (primary) hypertension: Secondary | ICD-10-CM | POA: Diagnosis not present

## 2015-08-27 DIAGNOSIS — K219 Gastro-esophageal reflux disease without esophagitis: Secondary | ICD-10-CM | POA: Diagnosis not present

## 2015-08-27 DIAGNOSIS — M259 Joint disorder, unspecified: Secondary | ICD-10-CM | POA: Diagnosis not present

## 2015-08-30 DIAGNOSIS — F250 Schizoaffective disorder, bipolar type: Secondary | ICD-10-CM | POA: Diagnosis not present

## 2015-09-07 DIAGNOSIS — F250 Schizoaffective disorder, bipolar type: Secondary | ICD-10-CM | POA: Diagnosis not present

## 2015-09-11 DIAGNOSIS — F250 Schizoaffective disorder, bipolar type: Secondary | ICD-10-CM | POA: Diagnosis not present

## 2015-09-14 DIAGNOSIS — F250 Schizoaffective disorder, bipolar type: Secondary | ICD-10-CM | POA: Diagnosis not present

## 2015-09-18 DIAGNOSIS — F250 Schizoaffective disorder, bipolar type: Secondary | ICD-10-CM | POA: Diagnosis not present

## 2015-09-24 DIAGNOSIS — F250 Schizoaffective disorder, bipolar type: Secondary | ICD-10-CM | POA: Diagnosis not present

## 2015-09-27 DIAGNOSIS — F250 Schizoaffective disorder, bipolar type: Secondary | ICD-10-CM | POA: Diagnosis not present

## 2015-09-28 DIAGNOSIS — F250 Schizoaffective disorder, bipolar type: Secondary | ICD-10-CM | POA: Diagnosis not present

## 2015-09-28 DIAGNOSIS — K219 Gastro-esophageal reflux disease without esophagitis: Secondary | ICD-10-CM | POA: Diagnosis not present

## 2015-09-28 DIAGNOSIS — M19042 Primary osteoarthritis, left hand: Secondary | ICD-10-CM | POA: Diagnosis not present

## 2015-09-28 DIAGNOSIS — I10 Essential (primary) hypertension: Secondary | ICD-10-CM | POA: Diagnosis not present

## 2015-09-28 DIAGNOSIS — M19041 Primary osteoarthritis, right hand: Secondary | ICD-10-CM | POA: Diagnosis not present

## 2015-10-01 ENCOUNTER — Emergency Department (INDEPENDENT_AMBULATORY_CARE_PROVIDER_SITE_OTHER)
Admission: EM | Admit: 2015-10-01 | Discharge: 2015-10-01 | Disposition: A | Discharge status: Home / Self Care | Payer: Medicare Other | Source: Home / Self Care

## 2015-10-01 ENCOUNTER — Encounter (HOSPITAL_COMMUNITY): Payer: Self-pay | Admitting: Emergency Medicine

## 2015-10-01 DIAGNOSIS — Z9109 Other allergy status, other than to drugs and biological substances: Secondary | ICD-10-CM

## 2015-10-01 DIAGNOSIS — R509 Fever, unspecified: Secondary | ICD-10-CM

## 2015-10-01 DIAGNOSIS — R3915 Urgency of urination: Secondary | ICD-10-CM

## 2015-10-01 DIAGNOSIS — Z91048 Other nonmedicinal substance allergy status: Secondary | ICD-10-CM | POA: Diagnosis not present

## 2015-10-01 MED ORDER — FLUTICASONE PROPIONATE 50 MCG/ACT NA SUSP: 2.0000 | Freq: Every day | NASAL | Status: AC

## 2015-10-01 NOTE — ED Notes (Signed)
Upon entering room to collect urine specimen patient states that she is going to wait on that until she gets a PCP.  RN and NP notified order canceled

## 2015-10-01 NOTE — ED Provider Notes (Signed)
CSN: [id number]     Arrival date & time 10/01/15  1310 History   None    Chief Complaint  Patient presents with  . URI   (Consider location/radiation/quality/duration/timing/severity/associated sxs/prior Treatment) HPI Comments: Patient states she was at a medical facility where they had MRSA and she is worried she may have MRSA.  She is having some low grade fever on and off for 2 weeks now.  She has been having some sinus drainage for 2 days. She has a mild sore throat.  She is worried about possiblty having Addison's disease.  She is concerned she had some abnormal labs some time back and did not follow up.  She also states she has urinary urgency.  Patient is a 64 y.o. female presenting with URI. The history is provided by the patient.  URI Presenting symptoms: fever and sore throat   Severity:  Mild Onset quality:  Sudden Duration:  2 weeks Progression:  Waxing and waning Chronicity:  New Relieved by:  Rest Worsened by:  Nothing tried Ineffective treatments:  None tried Risk factors: being elderly and sick contacts     Past Medical History  Diagnosis Date  . Hypertension   . Hypoglycemia    Past Surgical History  Procedure Laterality Date  . Eye surgery    . Abdominal surgery      blockage  . Bladder surgery     History reviewed. No pertinent family history. Social History  Substance Use Topics  . Smoking status: Former Games developer  . Smokeless tobacco: None  . Alcohol Use: No   OB History    No data available     Review of Systems  Constitutional: Positive for fever.  HENT: Positive for sore throat.   Eyes: Negative.   Respiratory: Negative.   Cardiovascular: Negative.   Gastrointestinal: Negative.   Endocrine: Negative.   Genitourinary: Positive for urgency.  Musculoskeletal: Negative.   Allergic/Immunologic: Negative.   Neurological: Negative.   Hematological: Negative.   Psychiatric/Behavioral: Negative.     Allergies  Benadryl; Haldol; Red dye;  Yeast-related products; and Yellow dyes (non-tartrazine)  Home Medications   Prior to Admission medications   Medication Sig Start Date End Date Taking? Authorizing Provider  aspirin-acetaminophen-caffeine (EXCEDRIN MIGRAINE) [phone] MG per tablet Take 1-2 tablets by mouth every 6 (six) hours as needed for headache (headache).    Historical Provider, MD  bismuth subsalicylate (PEPTO BISMOL) 262 MG/15ML suspension Take 30 mLs by mouth every 6 (six) hours as needed for indigestion.    Historical Provider, MD  carboxymethylcellulose (REFRESH PLUS) 0.5 % SOLN Place 1 drop into both eyes 3 (three) times daily as needed.    Historical Provider, MD  HYDROcodone-acetaminophen (NORCO/VICODIN) 5-325 MG per tablet Take 1 tablet by mouth every 6 (six) hours as needed for moderate pain. 12/05/14   Bethann Berkshire, MD   Meds Ordered and Administered this Visit  Medications - No data to display  BP 153/89 mmHg  Pulse 71  Temp(Src) 97.7 F (36.5 C) (Oral)  SpO2 100% No data found.   Physical Exam  Constitutional: She is oriented to person, place, and time. She appears well-developed and well-nourished.  HENT:  Head: Normocephalic and atraumatic.  Eyes: Conjunctivae are normal. Pupils are equal, round, and reactive to light.  Neck: Normal range of motion. Neck supple.  Cardiovascular: Normal rate and regular rhythm.   Pulmonary/Chest: Effort normal and breath sounds normal.  Abdominal: Soft. Bowel sounds are normal.  Neurological: She is alert and oriented to person,  place, and time.    ED Course  Procedures (including critical care time)  Labs Review Labs Reviewed - No data to display  Imaging Review No results found.   Visual Acuity Review  Right Eye Distance:   Left Eye Distance:   Bilateral Distance:    Right Eye Near:   Left Eye Near:    Bilateral Near:         MDM  No diagnosis found.     Deatra Canter, FNP 10/01/15 1701  Deatra Canter, FNP 10/01/15  1729  Deatra Canter, FNP 10/01/15 1730

## 2015-10-01 NOTE — ED Notes (Signed)
Pt has several complaints today.  She recently returned from "up [place]" where she may have been exposed to several things, including MRSA.  Pt reports a low grade fever, sore throat, and nasal congestion.

## 2015-10-01 NOTE — Discharge Instructions (Signed)
Allergies °Allergies may happen from anything your body is sensitive to. This may be food, medicines, pollens, chemicals, and nearly anything around you in everyday life that produces allergens. An allergen is anything that causes an allergy producing substance. Heredity is often a factor in causing these problems. This means you may have some of the same allergies as your parents. °Food allergies happen in all age groups. Food allergies are some of the most severe and life threatening. Some common food allergies are cow's milk, seafood, eggs, nuts, wheat, and soybeans. °SYMPTOMS  °· Swelling around the mouth. °· An itchy red rash or hives. °· Vomiting or diarrhea. °· Difficulty breathing. °SEVERE ALLERGIC REACTIONS ARE LIFE-THREATENING. °This reaction is called anaphylaxis. It can cause the mouth and throat to swell and cause difficulty with breathing and swallowing. In severe reactions only a trace amount of food (for example, peanut oil in a salad) may cause death within seconds. °Seasonal allergies occur in all age groups. These are seasonal because they usually occur during the same season every year. They may be a reaction to molds, grass pollens, or tree pollens. Other causes of problems are house dust mite allergens, pet dander, and mold spores. The symptoms often consist of nasal congestion, a runny itchy nose associated with sneezing, and tearing itchy eyes. There is often an associated itching of the mouth and ears. The problems happen when you come in contact with pollens and other allergens. Allergens are the particles in the air that the body reacts to with an allergic reaction. This causes you to release allergic antibodies. Through a chain of events, these eventually cause you to release histamine into the blood stream. Although it is meant to be protective to the body, it is this release that causes your discomfort. This is why you were given anti-histamines to feel better.  If you are unable to  pinpoint the offending allergen, it may be determined by skin or blood testing. Allergies cannot be cured but can be controlled with medicine. °Hay fever is a collection of all or some of the seasonal allergy problems. It may often be treated with simple over-the-counter medicine such as diphenhydramine. Take medicine as directed. Do not drink alcohol or drive while taking this medicine. Check with your caregiver or package insert for child dosages. °If these medicines are not effective, there are many new medicines your caregiver can prescribe. Stronger medicine such as nasal spray, eye drops, and corticosteroids may be used if the first things you try do not work well. Other treatments such as immunotherapy or desensitizing injections can be used if all else fails. Follow up with your caregiver if problems continue. These seasonal allergies are usually not life threatening. They are generally more of a nuisance that can often be handled using medicine. °HOME CARE INSTRUCTIONS  °· If unsure what causes a reaction, keep a diary of foods eaten and symptoms that follow. Avoid foods that cause reactions. °· If hives or rash are present: °¨ Take medicine as directed. °¨ You may use an over-the-counter antihistamine (diphenhydramine) for hives and itching as needed. °¨ Apply cold compresses (cloths) to the skin or take baths in cool water. Avoid hot baths or showers. Heat will make a rash and itching worse. °· If you are severely allergic: °¨ Following a treatment for a severe reaction, hospitalization is often required for closer follow-up. °¨ Wear a medic-alert bracelet or necklace stating the allergy. °¨ You and your family must learn how to give adrenaline or use   a medic-alert bracelet or necklace stating the allergy.  ¨ You and your family must learn how to give adrenaline or use an anaphylaxis kit.  ¨ If you have had a severe reaction, always carry your anaphylaxis kit or EpiPen® with you. Use this medicine as directed by your caregiver if a severe reaction is occurring. Failure to do so could have a fatal outcome.  SEEK MEDICAL  CARE IF:  · You suspect a food allergy. Symptoms generally happen within 30 minutes of eating a food.  · Your symptoms have not gone away within 2 days or are getting worse.  · You develop new symptoms.  · You want to retest yourself or your child with a food or drink you think causes an allergic reaction. Never do this if an anaphylactic reaction to that food or drink has happened before. Only do this under the care of a caregiver.  SEEK IMMEDIATE MEDICAL CARE IF:   · You have difficulty breathing, are wheezing, or have a tight feeling in your chest or throat.  · You have a swollen mouth, or you have hives, swelling, or itching all over your body.  · You have had a severe reaction that has responded to your anaphylaxis kit or an EpiPen®. These reactions may return when the medicine has worn off. These reactions should be considered life threatening.  MAKE SURE YOU:   · Understand these instructions.  · Will watch your condition.  · Will get help right away if you are not doing well or get worse.  Document Released: 03/10/2003 Document Revised: 04/11/2013 Document Reviewed: 08/14/2008  ExitCare® Patient Information ©2015 [employer]. This information is not intended to replace advice given to you by your health care provider. Make sure you discuss any questions you have with your health care provider.  Allergies  Allergies may happen from anything your body is sensitive to. This may be food, medicines, pollens, chemicals, and nearly anything around you in everyday life that produces allergens. An allergen is anything that causes an allergy producing substance. Heredity is often a factor in causing these problems. This means you may have some of the same allergies as your parents.  Food allergies happen in all age groups. Food allergies are some of the most severe and life threatening. Some common food allergies are cow's milk, seafood, eggs, nuts, wheat, and soybeans.  SYMPTOMS   · Swelling around the mouth.  · An  itchy red rash or hives.  · Vomiting or diarrhea.  · Difficulty breathing.  SEVERE ALLERGIC REACTIONS ARE LIFE-THREATENING.  This reaction is called anaphylaxis. It can cause the mouth and throat to swell and cause difficulty with breathing and swallowing. In severe reactions only a trace amount of food (for example, peanut oil in a salad) may cause death within seconds.  Seasonal allergies occur in all age groups. These are seasonal because they usually occur during the same season every year. They may be a reaction to molds, grass pollens, or tree pollens. Other causes of problems are house dust mite allergens, pet dander, and mold spores. The symptoms often consist of nasal congestion, a runny itchy nose associated with sneezing, and tearing itchy eyes. There is often an associated itching of the mouth and ears. The problems happen when you come in contact with pollens and other allergens. Allergens are the particles in the air that the body reacts to with an allergic reaction. This causes you to release allergic antibodies. Through a chain of events, these   eventually cause you to release histamine into the blood stream. Although it is meant to be protective to the body, it is this release that causes your discomfort. This is why you were given anti-histamines to feel better.  If you are unable to pinpoint the offending allergen, it may be determined by skin or blood testing. Allergies cannot be cured but can be controlled with medicine.  Hay fever is a collection of all or some of the seasonal allergy problems. It may often be treated with simple over-the-counter medicine such as diphenhydramine. Take medicine as directed. Do not drink alcohol or drive while taking this medicine. Check with your caregiver or package insert for child dosages.  If these medicines are not effective, there are many new medicines your caregiver can prescribe. Stronger medicine such as nasal spray, eye drops, and corticosteroids may  be used if the first things you try do not work well. Other treatments such as immunotherapy or desensitizing injections can be used if all else fails. Follow up with your caregiver if problems continue. These seasonal allergies are usually not life threatening. They are generally more of a nuisance that can often be handled using medicine.  HOME CARE INSTRUCTIONS   · If unsure what causes a reaction, keep a diary of foods eaten and symptoms that follow. Avoid foods that cause reactions.  · If hives or rash are present:  ¨ Take medicine as directed.  ¨ You may use an over-the-counter antihistamine (diphenhydramine) for hives and itching as needed.  ¨ Apply cold compresses (cloths) to the skin or take baths in cool water. Avoid hot baths or showers. Heat will make a rash and itching worse.  · If you are severely allergic:  ¨ Following a treatment for a severe reaction, hospitalization is often required for closer follow-up.  ¨ Wear a medic-alert bracelet or necklace stating the allergy.  ¨ You and your family must learn how to give adrenaline or use an anaphylaxis kit.  ¨ If you have had a severe reaction, always carry your anaphylaxis kit or EpiPen® with you. Use this medicine as directed by your caregiver if a severe reaction is occurring. Failure to do so could have a fatal outcome.  SEEK MEDICAL CARE IF:  · You suspect a food allergy. Symptoms generally happen within 30 minutes of eating a food.  · Your symptoms have not gone away within 2 days or are getting worse.  · You develop new symptoms.  · You want to retest yourself or your child with a food or drink you think causes an allergic reaction. Never do this if an anaphylactic reaction to that food or drink has happened before. Only do this under the care of a caregiver.  SEEK IMMEDIATE MEDICAL CARE IF:   · You have difficulty breathing, are wheezing, or have a tight feeling in your chest or throat.  · You have a swollen mouth, or you have hives, swelling, or  itching all over your body.  · You have had a severe reaction that has responded to your anaphylaxis kit or an EpiPen®. These reactions may return when the medicine has worn off. These reactions should be considered life threatening.  MAKE SURE YOU:   · Understand these instructions.  · Will watch your condition.  · Will get help right away if you are not doing well or get worse.  Document Released: 03/10/2003 Document Revised: 04/11/2013 Document Reviewed: 08/14/2008  ExitCare® Patient Information ©2015 [employer]. This information is not intended

## 2015-11-12 ENCOUNTER — Encounter (HOSPITAL_COMMUNITY): Payer: Self-pay | Admitting: Family Medicine

## 2015-11-12 ENCOUNTER — Emergency Department (HOSPITAL_COMMUNITY): Payer: Medicare Other

## 2015-11-12 ENCOUNTER — Emergency Department (HOSPITAL_COMMUNITY)
Admission: EM | Admit: 2015-11-12 | Discharge: 2015-11-12 | Discharge status: Against Medical Advice | Payer: Medicare Other | Attending: Emergency Medicine | Admitting: Emergency Medicine

## 2015-11-12 DIAGNOSIS — M79622 Pain in left upper arm: Secondary | ICD-10-CM | POA: Insufficient documentation

## 2015-11-12 DIAGNOSIS — I10 Essential (primary) hypertension: Secondary | ICD-10-CM | POA: Diagnosis not present

## 2015-11-12 DIAGNOSIS — R61 Generalized hyperhidrosis: Secondary | ICD-10-CM | POA: Diagnosis not present

## 2015-11-12 DIAGNOSIS — R079 Chest pain, unspecified: Secondary | ICD-10-CM | POA: Diagnosis not present

## 2015-11-12 LAB — I-STAT CHEM 8, ED
BUN: 18 mg/dL (ref 6–20)
CALCIUM ION: 1.09 mmol/L — AB (ref 1.13–1.30)
CHLORIDE: 106 mmol/L (ref 101–111)
CREATININE: 0.9 mg/dL (ref 0.44–1.00)
Glucose, Bld: 92 mg/dL (ref 65–99)
HCT: 43 % (ref 36.0–46.0)
Hemoglobin: 14.6 g/dL (ref 12.0–15.0)
Potassium: 4 mmol/L (ref 3.5–5.1)
Sodium: 140 mmol/L (ref 135–145)
TCO2: 25 mmol/L (ref 0–100)

## 2015-11-12 LAB — CBC
HEMATOCRIT: 40.6 % (ref 36.0–46.0)
Hemoglobin: 13.6 g/dL (ref 12.0–15.0)
MCH: 27.6 pg (ref 26.0–34.0)
MCHC: 33.5 g/dL (ref 30.0–36.0)
MCV: 82.5 fL (ref 78.0–100.0)
PLATELETS: 234 10*3/uL (ref 150–400)
RBC: 4.92 MIL/uL (ref 3.87–5.11)
RDW: 13.7 % (ref 11.5–15.5)
WBC: 6.7 10*3/uL (ref 4.0–10.5)

## 2015-11-12 LAB — I-STAT TROPONIN, ED: Troponin i, poc: 0 ng/mL (ref 0.00–0.08)

## 2015-11-12 NOTE — ED Notes (Signed)
Pt here for chest heaviness, sweating and left arm pain and numbness. sts for a while. sts dizzy when standing up.

## 2015-12-04 DIAGNOSIS — H35361 Drusen (degenerative) of macula, right eye: Secondary | ICD-10-CM | POA: Diagnosis not present

## 2015-12-04 DIAGNOSIS — H2511 Age-related nuclear cataract, right eye: Secondary | ICD-10-CM | POA: Diagnosis not present

## 2015-12-19 DIAGNOSIS — R69 Illness, unspecified: Secondary | ICD-10-CM | POA: Diagnosis not present

## 2015-12-19 DIAGNOSIS — Z7689 Persons encountering health services in other specified circumstances: Secondary | ICD-10-CM | POA: Diagnosis not present

## 2015-12-20 DIAGNOSIS — Z743 Need for continuous supervision: Secondary | ICD-10-CM | POA: Diagnosis not present

## 2015-12-20 DIAGNOSIS — F250 Schizoaffective disorder, bipolar type: Secondary | ICD-10-CM | POA: Diagnosis not present

## 2015-12-20 DIAGNOSIS — F29 Unspecified psychosis not due to a substance or known physiological condition: Secondary | ICD-10-CM | POA: Diagnosis not present

## 2015-12-20 DIAGNOSIS — Z9119 Patient's noncompliance with other medical treatment and regimen: Secondary | ICD-10-CM | POA: Diagnosis not present

## 2015-12-20 DIAGNOSIS — R410 Disorientation, unspecified: Secondary | ICD-10-CM | POA: Diagnosis not present

## 2015-12-20 DIAGNOSIS — F22 Delusional disorders: Secondary | ICD-10-CM | POA: Diagnosis not present

## 2015-12-20 DIAGNOSIS — Z915 Personal history of self-harm: Secondary | ICD-10-CM | POA: Diagnosis not present

## 2016-01-13 ENCOUNTER — Encounter (HOSPITAL_COMMUNITY): Payer: Self-pay

## 2016-01-13 ENCOUNTER — Emergency Department (HOSPITAL_COMMUNITY)
Admission: EM | Admit: 2016-01-13 | Discharge: 2016-01-14 | Disposition: A | Discharge status: Other Acute Inpatient Hospital | Payer: Medicare Other | Attending: Emergency Medicine | Admitting: Emergency Medicine

## 2016-01-13 DIAGNOSIS — F29 Unspecified psychosis not due to a substance or known physiological condition: Secondary | ICD-10-CM | POA: Diagnosis not present

## 2016-01-13 DIAGNOSIS — Z046 Encounter for general psychiatric examination, requested by authority: Secondary | ICD-10-CM | POA: Diagnosis present

## 2016-01-13 DIAGNOSIS — Z7951 Long term (current) use of inhaled steroids: Secondary | ICD-10-CM | POA: Diagnosis not present

## 2016-01-13 DIAGNOSIS — F22 Delusional disorders: Secondary | ICD-10-CM | POA: Insufficient documentation

## 2016-01-13 DIAGNOSIS — Z9119 Patient's noncompliance with other medical treatment and regimen: Secondary | ICD-10-CM | POA: Insufficient documentation

## 2016-01-13 DIAGNOSIS — Z87891 Personal history of nicotine dependence: Secondary | ICD-10-CM | POA: Insufficient documentation

## 2016-01-13 DIAGNOSIS — Z8719 Personal history of other diseases of the digestive system: Secondary | ICD-10-CM | POA: Diagnosis not present

## 2016-01-13 DIAGNOSIS — I10 Essential (primary) hypertension: Secondary | ICD-10-CM | POA: Insufficient documentation

## 2016-01-13 HISTORY — DX: Anxiety disorder, unspecified: F41.9

## 2016-01-13 HISTORY — DX: Schizoaffective disorder, unspecified: F25.9

## 2016-01-13 HISTORY — DX: Gastro-esophageal reflux disease without esophagitis: K21.9

## 2016-01-13 HISTORY — DX: Unspecified psychosis not due to a substance or known physiological condition: F29

## 2016-01-13 HISTORY — DX: Post-traumatic stress disorder, unspecified: F43.10

## 2016-01-13 HISTORY — DX: Patient's other noncompliance with medication regimen: Z91.14

## 2016-01-13 HISTORY — DX: Delusional disorders: F22

## 2016-01-13 LAB — COMPREHENSIVE METABOLIC PANEL
ALK PHOS: 115 U/L (ref 38–126)
ALT: 13 U/L — AB (ref 14–54)
AST: 24 U/L (ref 15–41)
Albumin: 4.5 g/dL (ref 3.5–5.0)
Anion gap: 10 (ref 5–15)
BILIRUBIN TOTAL: 0.6 mg/dL (ref 0.3–1.2)
BUN: 16 mg/dL (ref 6–20)
CALCIUM: 9.3 mg/dL (ref 8.9–10.3)
CHLORIDE: 106 mmol/L (ref 101–111)
CO2: 24 mmol/L (ref 22–32)
CREATININE: 1.02 mg/dL — AB (ref 0.44–1.00)
GFR, EST NON AFRICAN AMERICAN: 57 mL/min — AB (ref >60)
Glucose, Bld: 178 mg/dL — ABNORMAL HIGH (ref 65–99)
Potassium: 3.4 mmol/L — ABNORMAL LOW (ref 3.5–5.1)
Sodium: 140 mmol/L (ref 135–145)
TOTAL PROTEIN: 7.8 g/dL (ref 6.5–8.1)

## 2016-01-13 LAB — CBC WITH DIFFERENTIAL/PLATELET
BASOS ABS: 0 10*3/uL (ref 0.0–0.1)
BASOS PCT: 0 %
EOS ABS: 0.1 10*3/uL (ref 0.0–0.7)
EOS PCT: 1 %
HCT: 40.5 % (ref 36.0–46.0)
HEMOGLOBIN: 13.4 g/dL (ref 12.0–15.0)
Lymphocytes Relative: 17 %
Lymphs Abs: 1.8 10*3/uL (ref 0.7–4.0)
MCH: 27 pg (ref 26.0–34.0)
MCHC: 33.1 g/dL (ref 30.0–36.0)
MCV: 81.7 fL (ref 78.0–100.0)
Monocytes Absolute: 0.9 10*3/uL (ref 0.1–1.0)
Monocytes Relative: 8 %
NEUTROS PCT: 74 %
Neutro Abs: 7.7 10*3/uL (ref 1.7–7.7)
PLATELETS: 292 10*3/uL (ref 150–400)
RBC: 4.96 MIL/uL (ref 3.87–5.11)
RDW: 13.6 % (ref 11.5–15.5)
WBC: 10.5 10*3/uL (ref 4.0–10.5)

## 2016-01-13 LAB — ETHANOL: Alcohol, Ethyl (B): <5 mg/dL

## 2016-01-13 MED ORDER — HYPROMELLOSE (GONIOSCOPIC) 2.5 % OP SOLN: 1.0000 [drp] | OPHTHALMIC | Status: DC | PRN

## 2016-01-13 MED ORDER — ACETAMINOPHEN 325 MG PO TABS: 650.0000 mg | ORAL_TABLET | ORAL | Status: DC | PRN

## 2016-01-13 MED ORDER — ASPIRIN EC 81 MG PO TBEC: 81.0000 mg | DELAYED_RELEASE_TABLET | Freq: Every day | ORAL | Status: DC | PRN

## 2016-01-13 MED ORDER — FLUTICASONE PROPIONATE 50 MCG/ACT NA SUSP
2.0000 | Freq: Every day | NASAL | Status: DC
  Filled 2016-01-13: qty 16

## 2016-01-13 MED ORDER — LORAZEPAM 1 MG PO TABS: 1.0000 mg | ORAL_TABLET | Freq: Three times a day (TID) | ORAL | Status: DC | PRN

## 2016-01-13 NOTE — ED Notes (Signed)
Patient presents with officer, [place] and [place] to which a bystander called the police stating concern for her safety because she was walking close to the highway. When police presented patient was irrational. Patient has flight of ideas, is delusional and paranoid about "Obama and the police", states she does have a mental health history but has "been cleared". Flight of ideas present in triage, pt angry and paranoid.
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
## 2016-01-13 NOTE — ED Provider Notes (Signed)
CSN: [id number]     Arrival date & time 01/13/16  2126 History   First MD Initiated Contact with Patient 01/13/16 2146     Chief Complaint  Patient presents with  . V70.1      The history is provided by the patient and the police. The history is limited by the condition of the patient (psychosis).  Pt was seen at 2150. Per Police and pt: Pt was walking down the highway when bystanders called Police. Police found pt irrational and paranoid. Pt psychotic, delusional and agitated on arrival to ED. Stating "Obama was arrested for treason" and "my landlord was on TV robbing a bank." Pt states she has had a mental health history "but was cleared." Pt denies any complaints otherwise.   Past Medical History  Diagnosis Date  . Hypertension   . Anxiety   . GERD (gastroesophageal reflux disease)   . Noncompliance with medication regimen   . PTSD (post-traumatic stress disorder)   . Schizoaffective disorder (HCC)   . Psychosis   . Paranoid delusion [facility](HCC)    Past Surgical History  Procedure Laterality Date  . Eye surgery    . Abdominal surgery      blockage  . Bladder surgery      Social History  Substance Use Topics  . Smoking status: Former Games developermoker  . Smokeless tobacco: None  . Alcohol Use: No    Review of Systems  Unable to perform ROS: Psychiatric disorder      Allergies  Yellow dyes (non-tartrazine); Benadryl; Haldol; Red dye; and Yeast-related products  Home Medications   Prior to Admission medications   Medication Sig Start Date End Date Taking? Authorizing Provider  aspirin-acetaminophen-caffeine (EXCEDRIN MIGRAINE) [phone] MG per tablet Take 1-2 tablets by mouth every 6 (six) hours as needed for headache (headache).    Historical Provider, MD  bismuth subsalicylate (PEPTO BISMOL) 262 MG/15ML suspension Take 30 mLs by mouth every 6 (six) hours as needed for indigestion.    Historical Provider, MD  carboxymethylcellulose (REFRESH PLUS) 0.5 % SOLN Place 1 drop into  both eyes 3 (three) times daily as needed.    Historical Provider, MD  fluticasone (FLONASE) 50 MCG/ACT nasal spray Place 2 sprays into both nostrils daily. 10/01/15   Deatra CanterWilliam J Oxford, FNP  HYDROcodone-acetaminophen (NORCO/VICODIN) 5-325 MG per tablet Take 1 tablet by mouth every 6 (six) hours as needed for moderate pain. 12/05/14   Bethann BerkshireJoseph Zammit, MD   BP 162/109 mmHg  Pulse 107  Temp(Src) 98.1 F (36.7 C) (Oral)  Resp 16  Ht 5\' 4"  (1.626 m)  Wt 130 lb (58.968 kg)  BMI 22.30 kg/m2  SpO2 100% Physical Exam  2155: Physical examination:  Nursing notes reviewed; Vital signs and O2 SAT reviewed;  Constitutional: Well developed, Well nourished, Well hydrated, In no acute distress; Head:  Normocephalic, atraumatic; Eyes: EOMI, PERRL, No scleral icterus; ENMT: Mouth and pharynx normal, Mucous membranes moist; Neck: Supple, Full range of motion; Cardiovascular: Regular rate and rhythm; Respiratory: Breath sounds clear, No wheezes.  Speaking full sentences with ease, Normal respiratory effort/excursion; Chest: No deformity, Movement normal; Abdomen: Nondistended; Extremities: No deformity.; Neuro: Awake, alert.  Major CN grossly intact.  Speech clear. No gross focal motor deficits in extremities. Climbs on and off stretcher easily by herself. Gait steady.; Skin: Color normal, Warm, Dry.; Psych:  Easily agitated, +psychotic, +paranoid, +flight of ideas.     ED Course  Procedures (including critical care time) Labs Review   Imaging Review  I have  personally reviewed and evaluated these images and lab results as part of my medical decision-making.   EKG Interpretation None      MDM  MDM Reviewed: previous chart, nursing note and vitals Interpretation: labs   Results for orders placed or performed during the hospital encounter of 01/13/16  Ethanol  Result Value Ref Range   Alcohol, Ethyl (B) <5 <5 mg/dL  Comprehensive metabolic panel  Result Value Ref Range   Sodium 140 135 - 145 mmol/L    Potassium 3.4 (L) 3.5 - 5.1 mmol/L   Chloride 106 101 - 111 mmol/L   CO2 24 22 - 32 mmol/L   Glucose, Bld 178 (H) 65 - 99 mg/dL   BUN 16 6 - 20 mg/dL   Creatinine, Ser 1.61 (H) 0.44 - 1.00 mg/dL   Calcium 9.3 8.9 - 09.6 mg/dL   Total Protein 7.8 6.5 - 8.1 g/dL   Albumin 4.5 3.5 - 5.0 g/dL   AST 24 15 - 41 U/L   ALT 13 (L) 14 - 54 U/L   Alkaline Phosphatase 115 38 - 126 U/L   Total Bilirubin 0.6 0.3 - 1.2 mg/dL   GFR calc non Af Amer 57 (L) >60 mL/min   GFR calc Af Amer >60 >60 mL/min   Anion gap 10 5 - 15  CBC with Differential  Result Value Ref Range   WBC 10.5 4.0 - 10.5 K/uL   RBC 4.96 3.87 - 5.11 MIL/uL   Hemoglobin 13.4 12.0 - 15.0 g/dL   HCT 04.5 40.9 - 81.1 %   MCV 81.7 78.0 - 100.0 fL   MCH 27.0 26.0 - 34.0 pg   MCHC 33.1 30.0 - 36.0 g/dL   RDW 91.4 78.2 - 95.6 %   Platelets 292 150 - 400 K/uL   Neutrophils Relative % 74 %   Neutro Abs 7.7 1.7 - 7.7 K/uL   Lymphocytes Relative 17 %   Lymphs Abs 1.8 0.7 - 4.0 K/uL   Monocytes Relative 8 %   Monocytes Absolute 0.9 0.1 - 1.0 K/uL   Eosinophils Relative 1 %   Eosinophils Absolute 0.1 0.0 - 0.7 K/uL   Basophils Relative 0 %   Basophils Absolute 0.0 0.0 - 0.1 K/uL     2200:  IVC paperwork completed.   2330:  Urine pending. TTS to evaluate for admission. Holding orders written.   Samuel Jester, DO 01/13/16 2335

## 2016-01-14 ENCOUNTER — Inpatient Hospital Stay
Admission: RE | Admit: 2016-01-14 | Discharge: 2016-02-12 | DRG: 885 | Disposition: A | Discharge status: Home / Self Care | Payer: Medicare Other | Source: Intra-hospital | Attending: Psychiatry | Admitting: Psychiatry

## 2016-01-14 ENCOUNTER — Encounter: Payer: Self-pay | Admitting: Psychiatry

## 2016-01-14 DIAGNOSIS — G4700 Insomnia, unspecified: Secondary | ICD-10-CM | POA: Diagnosis present

## 2016-01-14 DIAGNOSIS — I2510 Atherosclerotic heart disease of native coronary artery without angina pectoris: Secondary | ICD-10-CM | POA: Diagnosis present

## 2016-01-14 DIAGNOSIS — Z91041 Radiographic dye allergy status: Secondary | ICD-10-CM | POA: Diagnosis not present

## 2016-01-14 DIAGNOSIS — F250 Schizoaffective disorder, bipolar type: Secondary | ICD-10-CM | POA: Diagnosis not present

## 2016-01-14 DIAGNOSIS — E8881 Metabolic syndrome: Secondary | ICD-10-CM | POA: Diagnosis present

## 2016-01-14 DIAGNOSIS — F3113 Bipolar disorder, current episode manic without psychotic features, severe: Secondary | ICD-10-CM | POA: Diagnosis not present

## 2016-01-14 DIAGNOSIS — F312 Bipolar disorder, current episode manic severe with psychotic features: Secondary | ICD-10-CM | POA: Diagnosis present

## 2016-01-14 DIAGNOSIS — K219 Gastro-esophageal reflux disease without esophagitis: Secondary | ICD-10-CM | POA: Diagnosis present

## 2016-01-14 DIAGNOSIS — F29 Unspecified psychosis not due to a substance or known physiological condition: Secondary | ICD-10-CM | POA: Diagnosis not present

## 2016-01-14 DIAGNOSIS — I10 Essential (primary) hypertension: Secondary | ICD-10-CM | POA: Diagnosis present

## 2016-01-14 DIAGNOSIS — Z888 Allergy status to other drugs, medicaments and biological substances status: Secondary | ICD-10-CM | POA: Diagnosis not present

## 2016-01-14 DIAGNOSIS — Z9889 Other specified postprocedural states: Secondary | ICD-10-CM | POA: Diagnosis not present

## 2016-01-14 DIAGNOSIS — Z87891 Personal history of nicotine dependence: Secondary | ICD-10-CM | POA: Diagnosis not present

## 2016-01-14 DIAGNOSIS — Z7951 Long term (current) use of inhaled steroids: Secondary | ICD-10-CM | POA: Diagnosis not present

## 2016-01-14 DIAGNOSIS — F22 Delusional disorders: Secondary | ICD-10-CM | POA: Diagnosis not present

## 2016-01-14 DIAGNOSIS — Z8719 Personal history of other diseases of the digestive system: Secondary | ICD-10-CM | POA: Diagnosis not present

## 2016-01-14 LAB — RAPID URINE DRUG SCREEN, HOSP PERFORMED
Amphetamines: NOT DETECTED
BARBITURATES: NOT DETECTED
BENZODIAZEPINES: NOT DETECTED
COCAINE: NOT DETECTED
Opiates: NOT DETECTED
TETRAHYDROCANNABINOL: NOT DETECTED

## 2016-01-14 LAB — URINALYSIS, ROUTINE W REFLEX MICROSCOPIC
BILIRUBIN URINE: NEGATIVE
GLUCOSE, UA: NEGATIVE mg/dL
HGB URINE DIPSTICK: NEGATIVE
KETONES UR: NEGATIVE mg/dL
Leukocytes, UA: NEGATIVE
Nitrite: NEGATIVE
PROTEIN: NEGATIVE mg/dL
Specific Gravity, Urine: 1.015 (ref 1.005–1.030)
pH: 5.5 (ref 5.0–8.0)

## 2016-01-14 MED ORDER — POLYVINYL ALCOHOL 1.4 % OP SOLN
1.0000 [drp] | OPHTHALMIC | Status: DC | PRN
  Filled 2016-01-14: qty 15

## 2016-01-14 NOTE — ED Notes (Addendum)
Pt lying in bed, rapid speech, responding to internal stimuli.

## 2016-01-14 NOTE — ED Notes (Signed)
Per Catia at [facility] by Dr. Ardyth HarpsHernandez Bed 319-A, call report to [phone].

## 2016-01-14 NOTE — ED Provider Notes (Signed)
Patient has been accepted at [facility] by Dr. Ardyth HarpsHernandez.   Jennifer Boozeavid Dhamar Gregory, MD 01/14/16 [phone]

## 2016-01-14 NOTE — ED Provider Notes (Signed)
Ford, TSS, states he has finished his evaluation of the patient and she meets inpatient criteria for admission, however, they do not have beds. He will work on placement.   Devoria AlbeIva Serrena Linderman, MD 01/14/16 [phone]

## 2016-01-14 NOTE — Progress Notes (Signed)
Pt accepted [facility], to Dr. Ardyth HarpsHernandez, bed 319A, call report at [phone]. RN Claris CheMargaret informed.  Melbourne Abtsatia Dabid [place], LCSWA Disposition staff 01/14/2016 6:15 PM

## 2016-01-14 NOTE — ED Notes (Signed)
[facility].

## 2016-01-14 NOTE — ED Notes (Signed)
Pt appears agitated, restless, pressured speech but cooperative for RN to obtain VS. Pt refusing all medication at present.

## 2016-01-15 DIAGNOSIS — F250 Schizoaffective disorder, bipolar type: Secondary | ICD-10-CM

## 2016-01-15 MED ORDER — OLANZAPINE 10 MG IM SOLR: 5.0000 mg | Freq: Once | INTRAMUSCULAR | Status: DC

## 2016-01-15 MED ORDER — ZIPRASIDONE MESYLATE 20 MG IM SOLR
20.0000 mg | INTRAMUSCULAR | Status: DC | PRN
  Filled 2016-01-15: qty 20

## 2016-01-15 MED ORDER — MAGNESIUM HYDROXIDE 400 MG/5ML PO SUSP: 30.0000 mL | Freq: Every day | ORAL | Status: DC | PRN

## 2016-01-15 MED ORDER — OLANZAPINE 10 MG IM SOLR: 5.0000 mg | Freq: Every day | INTRAMUSCULAR | Status: DC

## 2016-01-15 MED ORDER — OLANZAPINE 5 MG PO TBDP: 10.0000 mg | ORAL_TABLET | Freq: Every day | ORAL | Status: DC

## 2016-01-15 MED ORDER — LORAZEPAM 2 MG PO TABS
2.0000 mg | ORAL_TABLET | ORAL | Status: DC | PRN
  Filled 2016-01-15 (×2): qty 1

## 2016-01-15 MED ORDER — NICOTINE 21 MG/24HR TD PT24: 21.0000 mg | MEDICATED_PATCH | Freq: Every day | TRANSDERMAL | Status: DC

## 2016-01-15 MED ORDER — LORAZEPAM 2 MG PO TABS: 2.0000 mg | ORAL_TABLET | ORAL | Status: DC | PRN

## 2016-01-15 MED ORDER — TRAZODONE HCL 50 MG PO TABS
150.0000 mg | ORAL_TABLET | Freq: Every evening | ORAL | Status: DC | PRN
  Administered 2016-01-24: 150 mg via ORAL
  Filled 2016-01-15: qty 1

## 2016-01-15 MED ORDER — OLANZAPINE 10 MG IM SOLR
5.0000 mg | Freq: Once | INTRAMUSCULAR | Status: AC
  Administered 2016-01-15: 5 mg via INTRAMUSCULAR
  Filled 2016-01-15: qty 10

## 2016-01-15 MED ORDER — OLANZAPINE 5 MG PO TBDP
10.0000 mg | ORAL_TABLET | Freq: Once | ORAL | Status: DC
  Filled 2016-01-15: qty 2

## 2016-01-15 MED ORDER — ASPIRIN 81 MG PO CHEW
162.0000 mg | CHEWABLE_TABLET | Freq: Every day | ORAL | Status: DC
Start: 2016-01-15 — End: 2016-02-12
  Administered 2016-01-15 – 2016-01-23 (×3): 162 mg via ORAL
  Filled 2016-01-15 (×13): qty 2

## 2016-01-15 MED ORDER — ACETAMINOPHEN 325 MG PO TABS: 650.0000 mg | ORAL_TABLET | Freq: Four times a day (QID) | ORAL | Status: DC | PRN

## 2016-01-15 MED ORDER — LORAZEPAM 2 MG/ML IJ SOLN
2.0000 mg | INTRAMUSCULAR | Status: DC | PRN
  Administered 2016-01-19 – 2016-01-23 (×3): 2 mg via INTRAMUSCULAR
  Filled 2016-01-15 (×4): qty 1

## 2016-01-15 NOTE — Progress Notes (Signed)
Zyprexa  Ineffective. MD notified. Patient refused PO ativan.Patient pacing and loud in the hallways.

## 2016-01-15 NOTE — Progress Notes (Signed)
Patient visible on the milieu. Patient states slept poorly last evening. Appetite good. Energy level is high and hyper as well as hyper verbal. Patient says her concentration, depression and hopelessness are good. Patient denies drug use and is experiencing internal tremors and salt craving because of her self diagnosis of Addison's disease. She diverts from psych questions and says that God will heel her just like Madelaine Bhat and Eve. Patient complains of varicose vein causing leg pain but refuses pain medication nor will the patient rate pain level. Patient goal is to see her family and would like to be discharged to sister-in-law, [facility].

## 2016-01-15 NOTE — Progress Notes (Signed)
Pt admitted to unit. Pt is alert and oriented x4. Pt behavior is restless and speech is loud. Pt affect is labile and irritable upon approach. Thought process is tangential with paranoid delusions. Per IVC paperwork, pt was walking down the highway when a bystander called the police. Pt began to state "Obama was arrested for treason" and "my landlord was on TV robbing a bank". Pt continues to display paranoid behavior this evening. Pt refuses medications at this time despite encouragement from staff. Skin assessment performed and no contraband found. A scar is noted on pt's abdomen from a previous surgery. Pt oriented to unit. q15 minute safety checks maintained. Pt remains free from harm.

## 2016-01-15 NOTE — Tx Team (Signed)
Initial Interdisciplinary Treatment Plan   PATIENT STRESSORS: Financial difficulties Medication change or noncompliance Traumatic event   PATIENT STRENGTHS: Communication skills Physical Health   PROBLEM LIST: Problem List/Patient Goals Date to be addressed Date deferred Reason deferred Estimated date of resolution  Psychosis 01/15/16                                                      DISCHARGE CRITERIA:  Adequate post-discharge living arrangements Improved stabilization in mood, thinking, and/or behavior Motivation to continue treatment in a less acute level of care Need for constant or close observation no longer present Reduction of life-threatening or endangering symptoms to within safe limits Verbal commitment to aftercare and medication compliance  PRELIMINARY DISCHARGE PLAN: Attend aftercare/continuing care group Outpatient therapy Placement in alternative living arrangements  PATIENT/FAMIILY INVOLVEMENT: This treatment plan has been presented to and reviewed with the patient, Jennifer Leon, and/or family member.  The patient and family have been given the opportunity to ask questions and make suggestions.  Jennifer Leon 01/15/2016, 1:03 AM

## 2016-01-15 NOTE — BHH Counselor (Signed)
Patient is not able to participate in an PSA as she is pscyhotic, manic, hyper religioius, hyperverbal, labile, and irritable. CSW will attempt again tomorrow.
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
## 2016-01-15 NOTE — Progress Notes (Signed)
   01/15/16 0924  Psych Admission Type (Psych Patients Only)  Admission Status Involuntary  Psychosocial Assessment  Patient Complaints Agitation;Irritability;Restlessness  Eye Contact Intense  Facial Expression Angry  Affect Irritable;Labile  Speech Loud;Tangential;Aggressive;Pressured  Interaction Assertive;Intrusive;Cautious  Motor Activity Restless  Appearance/Hygiene Disheveled;In scrubs  Behavior Characteristics Agressive verbally;Agitated;Hyperactive;Irritable  Mood Labile;Suspicious;Irritable  Thought Ship broker  Content Paranoia  Delusions Paranoid  Perception WDL  Hallucination UTA  Judgment Impaired  Confusion Moderate  Danger to Self  Current suicidal ideation? Denies

## 2016-01-15 NOTE — BHH Group Notes (Signed)
[facility] Group Notes:  (Nursing/MHT/Case Management/Adjunct)  Date:  01/15/2016  Time:  2:04 PM  Type of Therapy:  Psychoeducational Skills  Participation Level:  Did Not Attend    Mickey Farber 01/15/2016, 2:04 PM

## 2016-01-15 NOTE — H&P (Addendum)
Psychiatric Admission Assessment Adult  Patient Identification: Jennifer Leon MRN:  [id number] Date of Evaluation:  01/15/2016 Chief Complaint:  schizoaffective Disorder Principal Diagnosis: Schizoaffective disorder, bipolar type ([place]) Diagnosis:   Patient Active Problem List   Diagnosis Date Noted  . Schizoaffective disorder, bipolar type ([place]) [F25.0] 01/14/2016   History of Present Illness:  Identifying data. Jennifer Leon is a 65 year old female with a history of schizoaffective disorder bipolar type.  Chief complaint. "This is against my religion."  History of present illness. Information was obtained mostly from the chart as the patient is disorganized, rambling, and hostile. Reportedly the patient was walking from [facility] and was picked up by police on the road for strange behavior. She was taken to [facility] emergency room and transferred to [facility] medical provider for psychiatric admission. The patient has been loud, argumentative, agitated, threatening, demanding to be released from commitment, demanding that she is treated for medical reasons, refusing psychiatric medications. Since admission she is been loud and argumentative and very intrusive. She does not believe she needs to see a doctor. It is against her religion. She only wants to seek a Teaching laboratory technician. She adamantly denies any symptoms of depression, anxiety, or psychosis. She denies alcohol or illicit substance use.   Past psychiatric history. The patient adamantly denies any. There is indication in the chart that she was treated or psychosis and paranoia in [place] in 2015. She is also allergic to Haldol which indicates that she was treated with Haldol before. She denies ever attempting suicide.  Family psychiatric history. Unknown.  Social history. At the patient is not forthcoming. Apparently she lives in [place]. There is no contact information in the chart. She  is 89 and has Medicare which indicates that she is on disability.  Total Time spent with patient: 1 hour  Past Psychiatric History: Psychosis.  Risk to Self: Is patient at risk for suicide?: No Risk to Others:   Prior Inpatient Therapy:   Prior Outpatient Therapy:    Alcohol Screening: 1. How often do you have a drink containing alcohol?: Never 2. How many drinks containing alcohol do you have on a typical day when you are drinking?: 1 or 2 3. How often do you have six or more drinks on one occasion?: Never Preliminary Score: 0 4. How often during the last year have you found that you were not able to stop drinking once you had started?: Never 5. How often during the last year have you failed to do what was normally expected from you becasue of drinking?: Never 6. How often during the last year have you needed a first drink in the morning to get yourself going after a heavy drinking session?: Never 7. How often during the last year have you had a feeling of guilt of remorse after drinking?: Never 8. How often during the last year have you been unable to remember what happened the night before because you had been drinking?: Never 9. Have you or someone else been injured as a result of your drinking?: No 10. Has a relative or friend or a doctor or another health worker been concerned about your drinking or suggested you cut down?: No Alcohol Use Disorder Identification Test Final Score (AUDIT): 0 Brief Intervention: AUDIT score less than 7 or less-screening does not suggest unhealthy drinking-brief intervention not indicated Substance Abuse History in the last 12 months:  No. Consequences of Substance Abuse: NA Previous Psychotropic Medications: Yes  Psychological Evaluations: No  Past Medical History:  Past Medical History  Diagnosis Date  . Hypertension   . Anxiety   . GERD (gastroesophageal reflux disease)   . Noncompliance with medication regimen   . PTSD (post-traumatic stress  disorder)   . Schizoaffective disorder ([place])   . Psychosis   . Paranoid delusion [facility])     Past Surgical History  Procedure Laterality Date  . Eye surgery    . Abdominal surgery      blockage  . Bladder surgery     Family History: History reviewed. No pertinent family history. Family Psychiatric  History: Unknown. Social History:  History  Alcohol Use No     History  Drug Use No    Social History   Social History  . Marital Status: Married    Spouse Name: N/A  . Number of Children: N/A  . Years of Education: N/A   Social History Main Topics  . Smoking status: Former Research scientist (life sciences)  . Smokeless tobacco: None  . Alcohol Use: No  . Drug Use: No  . Sexual Activity: Not Asked   Other Topics Concern  . None   Social History Narrative   Additional Social History:    Pain Medications: See PTA meds Prescriptions: See PTA meds Over the Counter: See PTA meds History of alcohol / drug use?: No history of alcohol / drug abuse                    Allergies:   Allergies  Allergen Reactions  . Yellow Dyes (Non-Tartrazine) Anaphylaxis and Swelling    Facial Swelling  . Benadryl [Diphenhydramine Hcl (Sleep)] Other (See Comments)    Keeps pt up  . Haldol [Haloperidol Lactate] Other (See Comments)    Takes vision away   . Red Dye Other (See Comments)    spasm   . Yeast-Related Products Other (See Comments)    Lowers sugar   Lab Results:  Results for orders placed or performed during the hospital encounter of 01/13/16 (from the past 48 hour(s))  Comprehensive metabolic panel     Status: Abnormal   Collection Time: 01/13/16 10:13 PM  Result Value Ref Range   Sodium 140 135 - 145 mmol/L   Potassium 3.4 (L) 3.5 - 5.1 mmol/L   Chloride 106 101 - 111 mmol/L   CO2 24 22 - 32 mmol/L   Glucose, Bld 178 (H) 65 - 99 mg/dL   BUN 16 6 - 20 mg/dL   Creatinine, Ser 1.02 (H) 0.44 - 1.00 mg/dL   Calcium 9.3 8.9 - 10.3 mg/dL   Total Protein 7.8 6.5 - 8.1 g/dL   Albumin 4.5 3.5  - 5.0 g/dL   AST 24 15 - 41 U/L   ALT 13 (L) 14 - 54 U/L   Alkaline Phosphatase 115 38 - 126 U/L   Total Bilirubin 0.6 0.3 - 1.2 mg/dL   GFR calc non Af Amer 57 (L) >60 mL/min   GFR calc Af Amer >60 >60 mL/min    Comment: (NOTE) The eGFR has been calculated using the CKD EPI equation. This calculation has not been validated in all clinical situations. eGFR's persistently <60 mL/min signify possible Chronic Kidney Disease.    Anion gap 10 5 - 15  CBC with Differential     Status: None   Collection Time: 01/13/16 10:13 PM  Result Value Ref Range   WBC 10.5 4.0 - 10.5 K/uL   RBC 4.96 3.87 - 5.11 MIL/uL   Hemoglobin 13.4 12.0 -  15.0 g/dL   HCT 40.5 36.0 - 46.0 %   MCV 81.7 78.0 - 100.0 fL   MCH 27.0 26.0 - 34.0 pg   MCHC 33.1 30.0 - 36.0 g/dL   RDW 13.6 11.5 - 15.5 %   Platelets 292 150 - 400 K/uL   Neutrophils Relative % 74 %   Neutro Abs 7.7 1.7 - 7.7 K/uL   Lymphocytes Relative 17 %   Lymphs Abs 1.8 0.7 - 4.0 K/uL   Monocytes Relative 8 %   Monocytes Absolute 0.9 0.1 - 1.0 K/uL   Eosinophils Relative 1 %   Eosinophils Absolute 0.1 0.0 - 0.7 K/uL   Basophils Relative 0 %   Basophils Absolute 0.0 0.0 - 0.1 K/uL  Ethanol     Status: None   Collection Time: 01/13/16 10:18 PM  Result Value Ref Range   Alcohol, Ethyl (B) <5 <5 mg/dL    Comment:        LOWEST DETECTABLE LIMIT FOR SERUM ALCOHOL IS 5 mg/dL FOR MEDICAL PURPOSES ONLY   Urine rapid drug screen (hosp performed)     Status: None   Collection Time: 01/14/16  2:20 AM  Result Value Ref Range   Opiates NONE DETECTED NONE DETECTED   Cocaine NONE DETECTED NONE DETECTED   Benzodiazepines NONE DETECTED NONE DETECTED   Amphetamines NONE DETECTED NONE DETECTED   Tetrahydrocannabinol NONE DETECTED NONE DETECTED   Barbiturates NONE DETECTED NONE DETECTED    Comment:        DRUG SCREEN FOR MEDICAL PURPOSES ONLY.  IF CONFIRMATION IS NEEDED FOR ANY PURPOSE, NOTIFY LAB WITHIN 5 DAYS.        LOWEST DETECTABLE LIMITS FOR  URINE DRUG SCREEN Drug Class       Cutoff (ng/mL) Amphetamine      1000 Barbiturate      200 Benzodiazepine   517 Tricyclics       001 Opiates          300 Cocaine          300 THC              50   Urinalysis, Routine w reflex microscopic     Status: None   Collection Time: 01/14/16  2:20 AM  Result Value Ref Range   Color, Urine YELLOW YELLOW   APPearance CLEAR CLEAR   Specific Gravity, Urine 1.015 1.005 - 1.030   pH 5.5 5.0 - 8.0   Glucose, UA NEGATIVE NEGATIVE mg/dL   Hgb urine dipstick NEGATIVE NEGATIVE   Bilirubin Urine NEGATIVE NEGATIVE   Ketones, ur NEGATIVE NEGATIVE mg/dL   Protein, ur NEGATIVE NEGATIVE mg/dL   Nitrite NEGATIVE NEGATIVE   Leukocytes, UA NEGATIVE NEGATIVE    Comment: MICROSCOPIC NOT DONE ON URINES WITH NEGATIVE PROTEIN, BLOOD, LEUKOCYTES, NITRITE, OR GLUCOSE <1000 mg/dL.    Metabolic Disorder Labs:  No results found for: HGBA1C, MPG No results found for: PROLACTIN No results found for: CHOL, TRIG, HDL, CHOLHDL, VLDL, LDLCALC  Current Medications: Current Facility-Administered Medications  Medication Dose Route Frequency Provider Last Rate Last Dose  . acetaminophen (TYLENOL) tablet 650 mg  650 mg Oral Q6H PRN Aadon Gorelik B Keltin Baird, MD      . aspirin chewable tablet 162 mg  162 mg Oral Daily Clovis Fredrickson, MD   162 mg at 01/15/16 0924  . LORazepam (ATIVAN) tablet 2 mg  2 mg Oral Q4H PRN Bess Saltzman B Elster Corbello, MD      . magnesium hydroxide (MILK OF MAGNESIA) suspension 30  mL  30 mL Oral Daily PRN Shivonne Schwartzman B Nial Hawe, MD      . nicotine (NICODERM CQ - dosed in mg/24 hours) patch 21 mg  21 mg Transdermal Q0600 Clovis Fredrickson, MD   21 mg at 01/15/16 0545  . OLANZapine zydis (ZYPREXA) disintegrating tablet 10 mg  10 mg Oral QHS Jeannine Pennisi B Doniqua Saxby, MD   10 mg at 01/15/16 0030  . traZODone (DESYREL) tablet 150 mg  150 mg Oral QHS PRN Lucion Dilger B Makhari Dovidio, MD       PTA Medications: Prescriptions prior to admission  Medication Sig Dispense  Refill Last Dose  . aspirin EC 81 MG tablet Take 81-162 mg by mouth daily as needed for mild pain.   01/12/2016  . bismuth subsalicylate (PEPTO BISMOL) 262 MG/15ML suspension Take 30 mLs by mouth every 6 (six) hours as needed for indigestion.   Past Week  . fluticasone (FLONASE) 50 MCG/ACT nasal spray Place 2 sprays into both nostrils daily. 16 g 2   . HYDROcodone-acetaminophen (NORCO/VICODIN) 5-325 MG per tablet Take 1 tablet by mouth every 6 (six) hours as needed for moderate pain. 20 tablet 0 Unknown at Unknown time  . hydroxypropyl methylcellulose / hypromellose (ISOPTO TEARS / GONIOVISC) 2.5 % ophthalmic solution Place 1 drop into both eyes as needed for dry eyes.   Past Week    Musculoskeletal: Strength & Muscle Tone: within normal limits Gait & Station: normal Patient leans: N/A  Psychiatric Specialty Exam: Physical Exam  Nursing note and vitals reviewed.   Review of Systems  Unable to perform ROS: mental acuity    Blood pressure 122/60, pulse 80, SpO2 100 %.There is no weight on file to calculate BMI.  See SRA.                                                  Sleep:  Number of Hours: 0.5     Treatment Plan Summary: Daily contact with patient to assess and evaluate symptoms and progress in treatment and Medication management   Ms. Segall is a 65 year old female with a history of psychosis most likely schizoaffective disorder admitted for disorganized and agitated and bizarre behavior.  1. Psychosis. At the patient has been refusing medications. We offered Zyprexa Zydis last night. We ordered forced medications and will continue Zyprexa oral and injections. Ativan is also available.  2. Coronary artery disease. We'll continue ASA.  3. Insomnia. She slept half an hour only. We offered trazodone. This may need to be adjusted.   4. Metabolic syndrome. Lipid panel, hemoglobin A1c and prolactin are pending.   5. Disposition. She will return to home. She  will follow up with [facility] in [place].  Is the patient at risk to self? Yes.   Has the patient been a risk to self in the past 6 months? Yes.   Has the patient been a risk to self within the distant past? Yes.   Is the patient a risk to others No. Has the patient been a risk to others in the past 6 months? No. Has the patient been a risk to others within the distant past? No.   Observation Level/Precautions:  15 minute checks  Laboratory:  CBC Chemistry Profile UDS UA  Psychotherapy:    Medications:    Consultations:    Discharge Concerns:    Estimated LOS:  Other:  I certify that inpatient services furnished can reasonably be expected to improve the patient's condition.   Bryanda Mikel 1/17/201710:31 AM

## 2016-01-15 NOTE — Tx Team (Signed)
Interdisciplinary Treatment Plan Update (Adult)  Date:  01/15/2016 Time Reviewed:  3:54 PM  Progress in Treatment: Attending groups: No. Participating in groups:  No. Taking medication as prescribed:  No. Tolerating medication:  No. Family/Significant othe contact made:  No, will contact:  if patient provides cosnent Patient understands diagnosis:  No. Discussing patient identified problems/goals with staff:  Yes. Medical problems stabilized or resolved:  Yes. Denies suicidal/homicidal ideation: Yes. Issues/concerns per patient self-inventory:  No. Other:  New problem(s) identified: No, Describe:  none reported  Discharge Plan or Barriers:Patient will stabilize on medications and discharge home. Patient will follow up with her outpatient provider at discharge for medication management and therapy.   Reason for Continuation of Hospitalization: Homicidal ideation Mania  Comments:  Estimated length of stay: up to 7 days expected discharge Tuesday 01/22/16  New goal(s):  Review of initial/current patient goals per problem list:   1.  Goal(s):participate in aftercare planning  Met:  No  Target date:by discharge  As evidenced [id number] participation in aftercare planning  2.  Goal (s):mania will become manageable  Met:  No  Target date:by discharge  As evidenced [id number] demonstrates decreased symptoms of mania   Attendees: Physician:  Orson Slick, MD 1/17/20173:54 PM  Nursing:   Drema Dallas, RN 1/17/20173:54 PM  Other:  Carmell Austria, [place] 1/17/20173:54 PM  Other:   1/17/20173:54 PM  Other:   1/17/20173:54 PM  Other:  1/17/20173:54 PM  Other:  1/17/20173:54 PM  Other:  1/17/20173:54 PM  Other:  1/17/20173:54 PM  Other:  1/17/20173:54 PM  Other:  1/17/20173:54 PM  Other:   1/17/20173:54 PM   Scribe for Treatment Team:   Keene Breath, MSW, LCSWA [phone]  01/15/2016, 3:54 PM

## 2016-01-15 NOTE — Plan of Care (Signed)
Problem: Alteration in thought process Goal: STG-Patient is able to sleep at least 6 hours per night Outcome: Not Progressing Patient restless.

## 2016-01-15 NOTE — B[facility] Group Notes (Signed)
[facility] Group Notes:  (Nursing/MHT/Case Management/Adjunct)  Date:  01/15/2016  Time:  9:58 PM  Type of Therapy:  Group Therapy  Participation Level:  None  Participation Quality:  Appropriate  Affect:  Appropriate  Cognitive:  Appropriate  Insight:  Appropriate  Engagement in Group:  None  Modes of Intervention:  Activity  Summary of Progress/Problems:  Burt Ek 01/15/2016, 9:58 PM

## 2016-01-15 NOTE — Progress Notes (Signed)
Recreation Therapy Notes  Date: 01.17.17 Time: 3:00 pm Location: Craft Room  Group Topic: Self-expression  Goal Area(s) Addresses:  Patient will be able to identify a color that represents each emotion. Patient will verbalize benefit of using art as a means of self-expression. Patient will verbalize one positive emotion experienced while participating in the activity.  Behavioral Response: Inattentive, Left early  Intervention: The Colors Within Me  Activity: Patients were given a blank face worksheet and instructed to analyze the emotions they were experiencing, pick a color for each emotion, and show on the face how much of that emotion they were experiencing.  Education: LRT educated patients on different forms of self-expression.  Education Outcome: Patient left before LRT educated group.  Clinical Observations/Feedback: Patient refused to participate in group activity stating that if she colored her emotions we would show it to someone and she would be here longer. Patient looked at her two pictures she carries. Patient began to cry. Patient started talking about her family and drugs. Patient left group at approximately 3:19 pm. Patient did not return to group.  Jacquelynn Cree, LRT/CTRS 01/15/2016 4:29 PM

## 2016-01-16 MED ORDER — OLANZAPINE 10 MG IM SOLR: 10.0000 mg | Freq: Every day | INTRAMUSCULAR | Status: DC

## 2016-01-16 MED ORDER — OLANZAPINE 10 MG IM SOLR
10.0000 mg | Freq: Every day | INTRAMUSCULAR | Status: DC
  Administered 2016-01-16 – 2016-02-11 (×27): 10 mg via INTRAMUSCULAR
  Filled 2016-01-16 (×27): qty 10

## 2016-01-16 MED ORDER — LITHIUM CARBONATE 300 MG PO CAPS: 300.0000 mg | ORAL_CAPSULE | Freq: Three times a day (TID) | ORAL | Status: DC

## 2016-01-16 MED ORDER — OLANZAPINE 5 MG PO TBDP
20.0000 mg | ORAL_TABLET | Freq: Every day | ORAL | Status: DC
  Filled 2016-01-16 (×4): qty 4

## 2016-01-16 MED ORDER — OLANZAPINE 5 MG PO TBDP: 20.0000 mg | ORAL_TABLET | Freq: Every day | ORAL | Status: DC

## 2016-01-16 NOTE — Progress Notes (Signed)
[facility] MD Progress Note  01/16/2016 2:40 PM Jennifer Leon  MRN:  [id number]  Subjective: Jennifer Leon is still loud, intrusive, easily agitated, argumentative, and contrary. She is on forced medications and Zyprexa is given as injection. She has been arguing with me all morning long about her diagnosis. She feels that her proper diagnosis and was on disease but admits that he has never been confirmed and he came up with it by searching the Internet. She accuses everybody the church members his landlord and hospital staff of lying about her condition. She feels that she is perfectly capable to handle her affairs. She admits that she lost her bank card, he has no access to her money, and possibly lost her apartment during this manic episode. She believes that she has been through a lot of mental illness in the past because of her faith.. She also tells me that she again will be cured on the days president Trump will be sworn in. In spite of paranoia and auditory hallucinations of religious content that she admits to the patient is slightly better today than yesterday. Yesterday I was unable to have a conversation with her at all. She was very angry. Today we were able to have a discussion of sorts. The patient agreed to take lithium.  Principal Problem: Schizoaffective disorder, bipolar type ([facility]) Diagnosis:   Patient Active Problem List   Diagnosis Date Noted  . Schizoaffective disorder, bipolar type ([facility]) [F25.0] 01/14/2016   Total Time spent with patient: 30 minutes  Past Psychiatric History: bipolar disorder. Past Medical History:  Past Medical History  Diagnosis Date  . Hypertension   . Anxiety   . GERD (gastroesophageal reflux disease)   . Noncompliance with medication regimen   . PTSD (post-traumatic stress disorder)   . Schizoaffective disorder ([facility])   . Psychosis   . Paranoid delusion [facility])     Past Surgical History  Procedure Laterality Date  . Eye surgery    . Abdominal surgery       blockage  . Bladder surgery     Family History: History reviewed. No pertinent family history. Family Psychiatric  Histormother reported.  Social History:  History  Alcohol Use No     History  Drug Use No    Social History   Social History  . Marital Status: Married    Spouse Name: N/A  . Number of Children: N/A  . Years of Education: N/A   Social History Main Topics  . Smoking status: Former Games developer  . Smokeless tobacco: None  . Alcohol Use: No  . Drug Use: No  . Sexual Activity: Not Asked   Other Topics Concern  . None   Social History Narrative   Additional Social History:    Pain Medications: See PTA meds Prescriptions: See PTA meds Over the Counter: See PTA meds History of alcohol / drug use?: No history of alcohol / drug abuse                    Sleep: Poor  Appetite:  Fair  Current Medications: Current Facility-Administered Medications  Medication Dose Route Frequency Provider Last Rate Last Dose  . acetaminophen (TYLENOL) tablet 650 mg  650 mg Oral Q6H PRN Myiesha Edgar B Rahaf Carbonell, MD      . aspirin chewable tablet 162 mg  162 mg Oral Daily Shari Prows, MD   162 mg at 01/15/16 0924  . LORazepam (ATIVAN) tablet 2 mg  2 mg Oral Q2H PRN Miyo Aina B  Jennet Maduro, MD       Or  . LORazepam (ATIVAN) injection 2 mg  2 mg Intramuscular Q4H PRN Shari Prows, MD   2 mg at 01/15/16 1944  . magnesium hydroxide (MILK OF MAGNESIA) suspension 30 mL  30 mL Oral Daily PRN Daeveon Zweber B Maximilien Hayashi, MD      . OLANZapine zydis (ZYPREXA) disintegrating tablet 20 mg  20 mg Oral QAC lunch Shari Prows, MD       Or  . OLANZapine (ZYPREXA) injection 10 mg  10 mg Intramuscular QAC lunch Jenniger Figiel B Deaven Barron, MD   10 mg at 01/16/16 1246  . OLANZapine zydis (ZYPREXA) disintegrating tablet 10 mg  10 mg Oral Once Evelen Vazguez B Blaire Palomino, MD      . traZODone (DESYREL) tablet 150 mg  150 mg Oral QHS PRN Delayla Hoffmaster B Baylen Dea, MD      . ziprasidone (GEODON) injection  20 mg  20 mg Intramuscular Q2H PRN Ruby Dilone B Benna Arno, MD   20 mg at 01/15/16 1947    Lab Results: No results found for this or any previous visit (from the past 48 hour(s)).  Physical Findings: AIMS: Facial and Oral Movements Muscles of Facial Expression: None, normal Lips and Perioral Area: None, normal Jaw: None, normal Tongue: None, normal,Extremity Movements Upper (arms, wrists, hands, fingers): None, normal Lower (legs, knees, ankles, toes): None, normal, Trunk Movements Neck, shoulders, hips: None, normal, Overall Severity Severity of abnormal movements (highest score from questions above): None, normal Incapacitation due to abnormal movements: None, normal Patient's awareness of abnormal movements (rate only patient's report): No Awareness, Dental Status Current problems with teeth and/or dentures?: No Does patient usually wear dentures?: No  CIWA:    COWS:     Musculoskeletal: Strength & Muscle Tone: within normal limits Gait & Station: normal Patient leans: N/A  Psychiatric Specialty Exam: Review of Systems  All other systems reviewed and are negative.   Blood pressure 128/72, pulse 101, resp. rate 18, SpO2 100 %.There is no weight on file to calculate BMI.  General Appearance: Disheveled  Eye Solicitor::  Fair  Speech:  Pressured  Volume:  Increased  Mood:  Angry, Dysphoric and Irritable  Affect:  Inappropriate and Labile  Thought Process:  Disorganized  Orientation:  Full (Time, Place, and Person)  Thought Content:  Delusions, Hallucinations: Auditory and Paranoid Ideation  Suicidal Thoughts:  No  Homicidal Thoughts:  No  Memory:  Immediate;   Fair Recent;   Fair Remote;   Fair  Judgement:  Poor  Insight:  Lacking  Psychomotor Activity:  Increased and Restlessness  Concentration:  Poor  Recall:  Poor  Fund of Knowledge:Fair  Language: Fair  Akathisia:  No  Handed:  Right  AIMS (if indicated):     Assets:  Communication Skills Desire for  Improvement Financial Resources/Insurance Housing Physical Health Resilience  ADL's:  Intact  Cognition: WNL  Sleep:  Number of Hours: 6.25   Treatment Plan Summary: Daily contact with patient to assess and evaluate symptoms and progress in treatment and Medication management   Jennifer Leon is a 65 year old female with a history of psychosis most likely schizoaffective disorder admitted for disorganized and agitated and bizarre behavior.  1. Mood/Psychosis. At the patient has been refusing oral medications.  We ordered forced medications and will continue Zyprexa oral or injections. Ativan is also available. We will start Lithium.   2. Coronary artery disease. We'll continue ASA.  3. Insomnia. She slept over six hours. We will continue trazodone.  4. Metabolic syndrome. Lipid panel, hemoglobin A1c and prolactin were ordered but the patient refused.    5. Disposition. She will return to home. She will follow up with [facility] in [place].   Zyan Coby 01/16/2016, 2:40 PM

## 2016-01-16 NOTE — BHH Counselor (Signed)
Patient remains too acute to participate in PSA

## 2016-01-16 NOTE — Progress Notes (Signed)
Early in shift, pt noted in hallway yelling, argumentative in [place]. Pt then calmed down, refused am medications. IM zyprexa given, pt did not want to take, began yelling and screaming blaming Obama for all the problems. Pt continued to yell and scream, later calmed down. Continues to pace up and down the hallway. Encouraged pt to calm down and take medications as prescribed. Pt continues to be disorganized. Pt remains safe on unit with q 15 min checks.

## 2016-01-16 NOTE — Progress Notes (Signed)
At beginning of shift, patient was very disruptive to the milieu. She was constantly screaming and yelling at other patients. She is very disorganized and intrusive. Attending was called for a PRN IM. Patient was approached with other nurse to give IM injection but patient screamed at both nurses. After going back and forth with the patient she stated she wanted a chance to calm down without giving the medication. She has not been an issues since and attended group. Safety has been maintained with 15 min checks. Continuing to monitor condition.

## 2016-01-16 NOTE — [facility] Group Notes (Signed)
[facility] Group Notes:  (Nursing/MHT/Case Management/Adjunct)  Date:  01/16/2016  Time:  12:24 PM  Type of Therapy:  Psychoeducational Skills  Participation Level:  Active  Participation Quality:  Appropriate, Attentive and Sharing  Affect:  Appropriate  Cognitive:  Appropriate  Insight:  Appropriate  Engagement in Group:  Engaged  Modes of Intervention:  Discussion, Education and Support  Summary of Progress/Problems:  Jennifer Leon [facility] 01/16/2016, 12:24 PM

## 2016-01-16 NOTE — Progress Notes (Signed)
Recreation Therapy Notes  Date: 01.18.17 Time: 3:00 pm Location: Craft Room  Group Topic: Self-esteem  Goal Area(s) Addresses:  Patient will write at least one positive trait. Patient will verbalize benefit of having a healthy self-esteem.  Behavioral Response: Did not attend   Intervention: I Am  Activity: Patients were given a worksheet with the letter I on it and instructed to write as many positive traits inside the letter.  Education: LRT educated patients on ways they can increase their self-esteem.  Education Outcome: Patient did not attend group.   Clinical Observations/Feedback: Patient did not attend group.  Jacquelynn Cree, LRT/CTRS 01/16/2016 4:47 PM

## 2016-01-16 NOTE — Plan of Care (Signed)
Problem: Ineffective individual coping Goal: STG: Pt will be able to identify effective and ineffective STG: Pt will be able to identify effective and ineffective coping patterns  Outcome: Not Progressing Pt agitated today

## 2016-01-16 NOTE — Plan of Care (Signed)
Problem: Alteration in thought process Goal: LTG-Patient has not harmed self or others in at least 2 days Outcome: Progressing Patient has remained free from harm since admission     

## 2016-01-17 NOTE — Progress Notes (Signed)
D: Initially went to evening group but left very early stating that " it is taking too long". Pt stayed in her room for majority of this shift. Denies pain, SI/AVH. Voiced no other concerns. A: Q15 minute checks maintained for safety. Encouragement and support provided.  R: Pt noted to be less irritable than previous shift but remains argumentative during interaction. Isolated to room this evening.

## 2016-01-17 NOTE — [facility] Group Notes (Signed)
[facility] Group Notes:  (Nursing/MHT/Case Management/Adjunct)  Date:  01/17/2016  Time:  12:00 AM  Type of Therapy:  Evening Wrap-up Group  Participation Level:  Did Not Attend  Participation Quality:  N/A  Affect:  N/A  Cognitive:  N/A  Insight:  None  Engagement in Group:  N/A  Modes of Intervention:  Discussion  Summary of Progress/Problems:  Jennifer Leon 01/17/2016, 12:00 AM

## 2016-01-17 NOTE — [facility] Counselor (Signed)
CSW attempted assessment but patient was too manic and disorganized to participate and would not sign consents but demanded to be released. Patient was irritable and labile and demanding that CSW spoke with her immediately. Patient will need further medication stabilization.

## 2016-01-17 NOTE — Progress Notes (Signed)
Recreation Therapy Notes  Date: 01.19.17 Time: 3:00 pm Location: Craft Room  Group Topic: Leisure Education  Goal Area(s) Addresses:  Patient will identify activities for each letter of the alphabet. Patient will verbalize ability to integrate positive leisure into life post d/c. Patient will verbalize ability to use leisure as a Associate Professor.  Behavioral Response: Arrived late, Left early  Intervention: Leisure Alphabet  Activity: Patients were given a Leisure Information systems manager and instructed to list healthy leisure activities for each letter of the alphabet.  Education: LRT educated patients on what they needed to participate in leisure.  Education Outcome: Patient left before LRT educated group.   Clinical Observations/Feedback: Patient arrived to group at approximately 3:23 pm. Patient did not participate in group activity stating, "I already know all of this." Patient did not contribute to group discussion. Patient left group at approximately 3:25 pm.  Jacquelynn Cree, LRT/CTRS 01/17/2016 4:32 PM

## 2016-01-17 NOTE — Progress Notes (Signed)
[facility] MD Progress Note  01/17/2016 12:34 PM Jennifer Leon  MRN:  [id number]  Subjective: Jennifer Leon is less irritable today. When left alone she is not yelling and screaming anymore. She is arguing about her medications. She refuses lithium was morning even though she requested it yesterday. She complains that we did not check her level status she refused labs yesterday as well. She took her a Zyprexa injection with no complaints today. She denies somatic symptoms or any symptoms of mental illness. She keeps telling me that she is no danger to herself or others but is terribly but is terribly disorganized, unable to plan for discharge, hallucinating, with poor insight into her problems. She ate in the dining area with everybody today. She tries to participate in groups but oftentimes is bored and irate and leaves group activities early.   Principal Problem: Schizoaffective disorder, bipolar type (HCC) Diagnosis:   Patient Active Problem List   Diagnosis Date Noted  . Schizoaffective disorder, bipolar type (HCC) [F25.0] 01/14/2016   Total Time spent with patient: 20 minutes  Past Psychiatric History: Bipolar illness.  Past Medical History:  Past Medical History  Diagnosis Date  . Hypertension   . Anxiety   . GERD (gastroesophageal reflux disease)   . Noncompliance with medication regimen   . PTSD (post-traumatic stress disorder)   . Schizoaffective disorder (HCC)   . Psychosis   . Paranoid delusion [facility])     Past Surgical History  Procedure Laterality Date  . Eye surgery    . Abdominal surgery      blockage  . Bladder surgery     Family History: History reviewed. No pertinent family history. Family Psychiatric  History: None reported. Social History:  History  Alcohol Use No     History  Drug Use No    Social History   Social History  . Marital Status: Married    Spouse Name: N/A  . Number of Children: N/A  . Years of Education: N/A   Social History Main Topics  .  Smoking status: Former Games developer  . Smokeless tobacco: None  . Alcohol Use: No  . Drug Use: No  . Sexual Activity: Not Asked   Other Topics Concern  . None   Social History Narrative   Additional Social History:    Pain Medications: See PTA meds Prescriptions: See PTA meds Over the Counter: See PTA meds History of alcohol / drug use?: No history of alcohol / drug abuse                    Sleep: Fair  Appetite:  Fair  Current Medications: Current Facility-Administered Medications  Medication Dose Route Frequency Provider Last Rate Last Dose  . acetaminophen (TYLENOL) tablet 650 mg  650 mg Oral Q6H PRN Elber Galyean B Lavina Resor, MD      . aspirin chewable tablet 162 mg  162 mg Oral Daily Shari Prows, MD   162 mg at 01/15/16 0924  . lithium carbonate capsule 300 mg  300 mg Oral TID WC Karrah Mangini B Talor Cheema, MD   300 mg at 01/16/16 1700  . LORazepam (ATIVAN) tablet 2 mg  2 mg Oral Q2H PRN Shari Prows, MD       Or  . LORazepam (ATIVAN) injection 2 mg  2 mg Intramuscular Q4H PRN Shari Prows, MD   2 mg at 01/15/16 1944  . magnesium hydroxide (MILK OF MAGNESIA) suspension 30 mL  30 mL Oral Daily PRN Lisa Blakeman B Jarae Panas,  MD      . OLANZapine zydis (ZYPREXA) disintegrating tablet 20 mg  20 mg Oral QAC lunch Shari Prows, MD       Or  . OLANZapine (ZYPREXA) injection 10 mg  10 mg Intramuscular QAC lunch Tonia Avino B Shakeia Krus, MD   10 mg at 01/16/16 1246  . OLANZapine zydis (ZYPREXA) disintegrating tablet 10 mg  10 mg Oral Once Tichina Koebel B Cayle Cordoba, MD      . traZODone (DESYREL) tablet 150 mg  150 mg Oral QHS PRN Jayelle Page B Donterrius Santucci, MD      . ziprasidone (GEODON) injection 20 mg  20 mg Intramuscular Q2H PRN Saavi Mceachron B Takila Kronberg, MD   20 mg at 01/15/16 1947    Lab Results: No results found for this or any previous visit (from the past 48 hour(s)).  Physical Findings: AIMS: Facial and Oral Movements Muscles of Facial Expression: None, normal Lips  and Perioral Area: None, normal Jaw: None, normal Tongue: None, normal,Extremity Movements Upper (arms, wrists, hands, fingers): None, normal Lower (legs, knees, ankles, toes): None, normal, Trunk Movements Neck, shoulders, hips: None, normal, Overall Severity Severity of abnormal movements (highest score from questions above): None, normal Incapacitation due to abnormal movements: None, normal Patient's awareness of abnormal movements (rate only patient's report): No Awareness, Dental Status Current problems with teeth and/or dentures?: No Does patient usually wear dentures?: No  CIWA:    COWS:     Musculoskeletal: Strength & Muscle Tone: within normal limits Gait & Station: normal Patient leans: N/A  Psychiatric Specialty Exam: Review of Systems  All other systems reviewed and are negative.   Blood pressure 128/72, pulse 101, resp. rate 18, SpO2 100 %.There is no weight on file to calculate BMI.  General Appearance: Fairly Groomed  Patent attorney::  Fair  Speech:  Pressured  Volume:  Increased  Mood:  Angry, Dysphoric and Irritable  Affect:  Inappropriate and Labile  Thought Process:  Disorganized  Orientation:  Full (Time, Place, and Person)  Thought Content:  Delusions, Hallucinations: Auditory and Paranoid Ideation  Suicidal Thoughts:  No  Homicidal Thoughts:  No  Memory:  Immediate;   Fair Recent;   Fair Remote;   Fair  Judgement:  Poor  Insight:  Lacking  Psychomotor Activity:  Increased  Concentration:  Fair  Recall:  [employer] of Knowledge:Fair  Language: Fair  Akathisia:  No  Handed:  Right  AIMS (if indicated):     Assets:  Communication Skills Desire for Improvement Financial Resources/Insurance Housing Physical Health Resilience  ADL's:  Intact  Cognition: WNL  Sleep:  Number of Hours: 7   Treatment Plan Summary: Daily contact with patient to assess and evaluate symptoms and progress in treatment and Medication management   [name] is a  65 year old female with a history of psychosis most likely schizoaffective disorder admitted for disorganized and agitated and bizarre behavior.  1. Mood/Psychosis. As the patient has been refusing oral medications we ordered forced medications and will continue Zyprexa oral or injections. Ativan is also available. We started Lithium last night with, we believed, patient's approval but she refused.   2. Coronary artery disease. We'll continue ASA.  3. Insomnia. She slept 7hours. We will continue trazodone.   4. Metabolic syndrome. Lipid panel, hemoglobin A1c and prolactin were ordered but the patient refused.   5. Disposition. She will return to home. She will follow up with [facility] in [place].  Ademide Schaberg 01/17/2016, 12:34 PM

## 2016-01-17 NOTE — Tx Team (Signed)
Interdisciplinary Treatment Plan Update (Adult)  Date:  01/17/2016 Time Reviewed:  1:58 PM  Progress in Treatment: Attending groups: Yes. Participating in groups:  Yes. Taking medication as prescribed:  Yes. Patient on forced medications Tolerating medication:  Yes. Family/Significant othe contact made:  No, will contact:  if patient provides cosnent Patient understands diagnosis:  No. Discussing patient identified problems/goals with staff:  Yes. Medical problems stabilized or resolved:  Yes. Denies suicidal/homicidal ideation: Yes. Issues/concerns per patient self-inventory:  No. Other:  New problem(s) identified: No, Describe:  none reported  Discharge Plan or Barriers:Patient will stabilize on medications and discharge home. Patient will follow up with her outpatient provider at discharge for medication management and therapy.   Reason for Continuation of Hospitalization: Homicidal ideation Mania  Comments:  Estimated length of stay: up to 4 days expected discharge Tuesday 01/22/16  New goal(s):  Review of initial/current patient goals per problem list:   1.  Goal(s):participate in aftercare planning  Met:  No  Target date:by discharge  As evidenced VZ:CHYIFOY participation in aftercare plan AEB aftercare provider and housing plan at discharge being identified  2.  Goal (s):mania will become manageable  Met:  No  Target date:by discharge  As evidenced DX:AJOINOM demonstrates decreased signs and symptoms of mania   Attendees: Physician:  Orson Slick, MD 1/19/20171:58 PM  Nursing:   Carolynn Sayers, RN 1/19/20171:58 PM  Other:  Carmell Austria, [place] [facility] 1/19/20171:58 PM  Other:   1/19/20171:58 PM  Other:   1/19/20171:58 PM  Other:  1/19/20171:58 PM  Other:  1/19/20171:58 PM  Other:  1/19/20171:58 PM  Other:  1/19/20171:58 PM  Other:  1/19/20171:58 PM  Other:  1/19/20171:58 PM  Other:   1/19/20171:58 PM   Scribe for Treatment Team:   Keene Breath,  MSW, LCSWA [phone]  01/17/2016, 1:58 PM

## 2016-01-17 NOTE — Plan of Care (Signed)
Problem: Ineffective individual coping Goal: STG-Increase in ability to manage activities of daily living Outcome: Progressing Pt able to calm self and do own adls

## 2016-01-17 NOTE — [facility] Group Notes (Signed)
[facility] LCSW Group Therapy  01/17/2016 1:29 PM  Type of Therapy:  Group Therapy  Participation Level:  Active  Participation Quality:  Monopolizing and Redirectable  Affect:  Labile  Cognitive:  Alert  Insight:  Limited  Engagement in Therapy:  Off Topic  Modes of Intervention:  Discussion, Education, Socialization and Support  Summary of Progress/Problems: Emotional Regulation: Patients will identify both negative and positive emotions. They will discuss emotions they have difficulty regulating and how they impact their lives. Patients will be asked to identify healthy coping skills to combat unhealthy reactions to negative emotions.   Toyia discussed feeling as though people are not listening to her. She believes the mental health system is trying to make her "dependent" but she can take care of herself. She states she has a medical problem called addison diease and when she becomes stressed she has difficulties functioning. However, this is a medial problem and cannot be treated by mental health medication. She states the mental health medications cause harm to her health and are not treating the actual problem.   [employer] MSW, LCSWA  01/17/2016, 1:29 PM

## 2016-01-17 NOTE — Progress Notes (Signed)
Pt continuing to refuse PO medications. Im medications given as ordered. Pt continued to yell and fuss afterwards, nurse was able to re-direct patient. Eating meals in dayroom. Noted to be intrusive at times. Encouraged pt to calm self and to stop yelling in halls, pt states she just won't talk. Pt remains safe on unit. Will continue to assess and monitor for safety

## 2016-01-17 NOTE — [facility] Group Notes (Signed)
[facility] Group Notes:  (Nursing/MHT/Case Management/Adjunct)  Date:  01/17/2016  Time:  1:49 PM  Type of Therapy:  Group Therapy  Participation Level:  Active  Participation Quality:  Intrusive and Monopolizing  Affect:  Defensive and Labile  Cognitive:  Confused  Insight:  Lacking  Engagement in Group:  Lacking  Modes of Intervention:  Activity  Summary of Progress/Problems:  Cathyrn Deas De'Chelle Jenne Sellinger 01/17/2016, 1:49 PM

## 2016-01-18 NOTE — Progress Notes (Signed)
[facility] MD Progress Note  01/18/2016 2:46 PM Jennifer Leon  MRN:  [id number]  Subjective:  Jennifer Leon has a history of bipolar illness admitted in a manic psychotic episode after picked up by police walking [place] the highway. She refuses oral medications and has been on forced medications. She takes Zyprexa injections without problems. She is still intrusive, disorganized and argumentative. She has no insight into mental illness and believes she needs to be treated for Addison's disease. She believes she has all the symptoms but admits that she has never been diagnosed. VS are stable, electrolytes normal.  She eagerly participates in programming but can be disruptive. There are no somatic complaints.   Principal Problem: Schizoaffective disorder, bipolar type (HCC) Diagnosis:   Patient Active Problem List   Diagnosis Date Noted  . Schizoaffective disorder, bipolar type (HCC) [F25.0] 01/14/2016   Total Time spent with patient: 20 minutes  Past Psychiatric History: bipolar disorder.  Past Medical History:  Past Medical History  Diagnosis Date  . Hypertension   . Anxiety   . GERD (gastroesophageal reflux disease)   . Noncompliance with medication regimen   . PTSD (post-traumatic stress disorder)   . Schizoaffective disorder (HCC)   . Psychosis   . Paranoid delusion [facility])     Past Surgical History  Procedure Laterality Date  . Eye surgery    . Abdominal surgery      blockage  . Bladder surgery     Family History: History reviewed. No pertinent family history. Family Psychiatric  History: none reported. Social History:  History  Alcohol Use No     History  Drug Use No    Social History   Social History  . Marital Status: Married    Spouse Name: N/A  . Number of Children: N/A  . Years of Education: N/A   Social History Main Topics  . Smoking status: Former Games developer  . Smokeless tobacco: None  . Alcohol Use: No  . Drug Use: No  . Sexual Activity: Not Asked   Other  Topics Concern  . None   Social History Narrative   Additional Social History:    Pain Medications: See PTA meds Prescriptions: See PTA meds Over the Counter: See PTA meds History of alcohol / drug use?: No history of alcohol / drug abuse                    Sleep: Fair  Appetite:  Fair  Current Medications: Current Facility-Administered Medications  Medication Dose Route Frequency Provider Last Rate Last Dose  . acetaminophen (TYLENOL) tablet 650 mg  650 mg Oral Q6H PRN Magaret Justo B Bralynn Donado, MD      . aspirin chewable tablet 162 mg  162 mg Oral Daily Shari Prows, MD   162 mg at 01/15/16 0924  . lithium carbonate capsule 300 mg  300 mg Oral TID WC Allard Lightsey B Rick Warnick, MD   300 mg at 01/16/16 1700  . LORazepam (ATIVAN) tablet 2 mg  2 mg Oral Q2H PRN Shari Prows, MD       Or  . LORazepam (ATIVAN) injection 2 mg  2 mg Intramuscular Q4H PRN Shari Prows, MD   2 mg at 01/15/16 1944  . magnesium hydroxide (MILK OF MAGNESIA) suspension 30 mL  30 mL Oral Daily PRN Lajuana Patchell B Aiken Withem, MD      . OLANZapine zydis (ZYPREXA) disintegrating tablet 20 mg  20 mg Oral QAC lunch Shari Prows, MD  Or  . OLANZapine (ZYPREXA) injection 10 mg  10 mg Intramuscular QAC lunch Alexxa Sabet B Brock Mokry, MD   10 mg at 01/18/16 1133  . OLANZapine zydis (ZYPREXA) disintegrating tablet 10 mg  10 mg Oral Once Ashima Shrake B Burnell Hurta, MD      . traZODone (DESYREL) tablet 150 mg  150 mg Oral QHS PRN Kenndra Morris B Kelly Ranieri, MD      . ziprasidone (GEODON) injection 20 mg  20 mg Intramuscular Q2H PRN Deontrae Drinkard B Latana Colin, MD   20 mg at 01/15/16 1947    Lab Results: No results found for this or any previous visit (from the past 48 hour(s)).  Physical Findings: AIMS: Facial and Oral Movements Muscles of Facial Expression: None, normal Lips and Perioral Area: None, normal Jaw: None, normal Tongue: None, normal,Extremity Movements Upper (arms, wrists, hands, fingers): None,  normal Lower (legs, knees, ankles, toes): None, normal, Trunk Movements Neck, shoulders, hips: None, normal, Overall Severity Severity of abnormal movements (highest score from questions above): None, normal Incapacitation due to abnormal movements: None, normal Patient's awareness of abnormal movements (rate only patient's report): No Awareness, Dental Status Current problems with teeth and/or dentures?: No Does patient usually wear dentures?: No  CIWA:    COWS:     Musculoskeletal: Strength & Muscle Tone: within normal limits Gait & Station: normal Patient leans: N/A  Psychiatric Specialty Exam: Review of Systems  All other systems reviewed and are negative.   Blood pressure 108/76, pulse 89, resp. rate 18, SpO2 100 %.There is no weight on file to calculate BMI.  General Appearance: Fairly Groomed  Patent attorney::  Good  Speech:  Pressured  Volume:  Increased  Mood:  Angry, Dysphoric and Irritable  Affect:  Inappropriate and Labile  Thought Process:  Disorganized  Orientation:  Full (Time, Place, and Person)  Thought Content:  Delusions, Hallucinations: Auditory and Paranoid Ideation  Suicidal Thoughts:  No  Homicidal Thoughts:  No  Memory:  Immediate;   Fair Recent;   Fair Remote;   Fair  Judgement:  Poor  Insight:  Lacking  Psychomotor Activity:  Increased  Concentration:  Fair  Recall:  [employer] of Knowledge:Fair  Language: Fair  Akathisia:  No  Handed:  Right  AIMS (if indicated):     Assets:  Communication Skills Desire for Improvement Financial Resources/Insurance Housing Physical Health Resilience  ADL's:  Intact  Cognition: WNL  Sleep:  Number of Hours: 8.75   Treatment Plan Summary: Daily contact with patient to assess and evaluate symptoms and progress in treatment and Medication management   Jennifer Leon is a 65 year old female with a history of psychosis most likely schizoaffective disorder admitted for disorganized, agitated and bizarre  behavior.  1. Mood/Psychosis. As the patient has been refusing oral medications we ordered forced medications and will continue Zyprexa oral or injections. Ativan is also available. We started Lithium upon her request but she changed her mind and refused.   2. Coronary artery disease. We continued ASA.  3. Insomnia. She slept 9 hours. We continued trazodone.   4. Metabolic syndrome. Lipid panel, hemoglobin A1c and prolactin were ordered but the patient refused.   5. Disposition. She will return to home. She will follow up with [facility] in [place].  Damary Doland 01/18/2016, 2:46 PM

## 2016-01-18 NOTE — Plan of Care (Signed)
Problem: Alteration in thought process Goal: LTG-Patient behavior demonstrates decreased signs psychosis (Patient behavior demonstrates decreased signs of psychosis to the point the patient is safe to return home and continue treatment in an outpatient setting.)  Outcome: Not Progressing Patient is disorganized with no insight.

## 2016-01-18 NOTE — B[facility] Counselor (Signed)
CSW attempted assessment but patient is too acute for assessment at this time. Patient continues to be disorganized and irritable asking to discharge stating she cannot be kept in the hospital against her will.

## 2016-01-18 NOTE — Progress Notes (Signed)
D: Pt denies SI/HI/AVH. Pt is pleasant and cooperative with care this evening, patients behavior is calm and affect is flat. Patient appears less anxious and she is in bed with eyes closed.   A: Pt was offered support and encouragement. Pt was encouraged to attend groups. Q 15 minute checks were done for safety.  R:Pt did not attend group.  Pt has no complaints.Pt receptive to treatment and safety maintained on unit.

## 2016-01-18 NOTE — Progress Notes (Signed)
Patient is disorganized,argumentative & no insight with her mental illness.States "you are violating my rights to refuse medications."Refused all PO medications.Visible in the milieu but minimal interactions with peers.Attended groups.

## 2016-01-18 NOTE — Plan of Care (Signed)
Problem: Alteration in thought process Goal: LTG-Patient behavior demonstrates decreased signs psychosis (Patient behavior demonstrates decreased signs of psychosis to the point the patient is safe to return home and continue treatment in an outpatient setting.)  Outcome: Progressing Thoughts are disorganized at times, but no bizarre behavior noted.

## 2016-01-18 NOTE — Progress Notes (Signed)
Recreation Therapy Notes  Date: 01.20.17 Time: 3:00 pm Location: Craft Room  Group Topic: Coping Skills  Goal Area(s) Addresses:  Patient will participate in healthy coping skill.  Patient will verbalize one emotion experienced in group.  Behavioral Response: Attentive  Intervention: Coloring  Activity: Patients were given coloring sheets and instructed to color.  Education: LRT educated patients to color.  Education Outcome: In group clarification offered  Clinical Observations/Feedback: Patient got a little upset with peer. Patient calmed down and started coloring. Patient did not contribute to group discussion.  Jacquelynn Cree, LRT/CTRS 01/18/2016 4:39 PM

## 2016-01-18 NOTE — [facility] Group Notes (Signed)
[facility] Group Notes:  (Nursing/MHT/Case Management/Adjunct)  Date:  01/18/2016  Time:  12:29 PM  Type of Therapy:  Psychoeducational Skills  Participation Level:  None  Participation Quality:  Intrusive and Inattentive  Affect:  Angry and Irritable  Cognitive:  Confused, Delusional and Lacking  Insight:  Lacking  Engagement in Group:  Lacking  Modes of Intervention:  Activity and Problem-solving  Summary of Progress/Problems:  Vince Ainsley De'Chelle Cheral Cappucci 01/18/2016, 12:29 PM

## 2016-01-19 NOTE — Progress Notes (Signed)
[facility] MD Progress Note  01/19/2016 2:19 PM Azalya Galyon  MRN:  [id number]  Subjective:  Ms. Koogler has a history of bipolar illness admitted in a manic psychotic episode after picked up by police walking [place] the highway. She refuses oral medications and has been on forced medications. Today nursing indicates she refused her oral medications and thus they had to give her an injection/forced medication. When I approach patient she asked if I was the psychiatrist and I stated I was. She stated "that I don't want to talk to you." However as I departed she then stated that she wanted no medications and discharge. I indicated I would document her request and that these were her goals. She then made a statement as I was leaving to the affect of whether I would be in this area or from this area or possible that I better not be seen outside of this area.   Principal Problem: Schizoaffective disorder, bipolar type (HCC) Diagnosis:   Patient Active Problem List   Diagnosis Date Noted  . Schizoaffective disorder, bipolar type (HCC) [F25.0] 01/14/2016   Total Time spent with patient: 20 minutes  Past Psychiatric History: bipolar disorder.  Past Medical History:  Past Medical History  Diagnosis Date  . Hypertension   . Anxiety   . GERD (gastroesophageal reflux disease)   . Noncompliance with medication regimen   . PTSD (post-traumatic stress disorder)   . Schizoaffective disorder (HCC)   . Psychosis   . Paranoid delusion [facility])     Past Surgical History  Procedure Laterality Date  . Eye surgery    . Abdominal surgery      blockage  . Bladder surgery     Family History: History reviewed. No pertinent family history. Family Psychiatric  History: none reported. Social History:  History  Alcohol Use No     History  Drug Use No    Social History   Social History  . Marital Status: Married    Spouse Name: N/A  . Number of Children: N/A  . Years of Education: N/A   Social History Main  Topics  . Smoking status: Former Games developer  . Smokeless tobacco: None  . Alcohol Use: No  . Drug Use: No  . Sexual Activity: Not Asked   Other Topics Concern  . None   Social History Narrative   Additional Social History:    Pain Medications: See PTA meds Prescriptions: See PTA meds Over the Counter: See PTA meds History of alcohol / drug use?: No history of alcohol / drug abuse                    Sleep: Fair  Appetite:  Fair  Current Medications: Current Facility-Administered Medications  Medication Dose Route Frequency Provider Last Rate Last Dose  . acetaminophen (TYLENOL) tablet 650 mg  650 mg Oral Q6H PRN Jolanta B Pucilowska, MD      . aspirin chewable tablet 162 mg  162 mg Oral Daily Shari Prows, MD   162 mg at 01/15/16 0924  . LORazepam (ATIVAN) tablet 2 mg  2 mg Oral Q2H PRN Shari Prows, MD       Or  . LORazepam (ATIVAN) injection 2 mg  2 mg Intramuscular Q4H PRN Shari Prows, MD   2 mg at 01/15/16 1944  . magnesium hydroxide (MILK OF MAGNESIA) suspension 30 mL  30 mL Oral Daily PRN Jolanta B Pucilowska, MD      . OLANZapine zydis (ZYPREXA)  disintegrating tablet 20 mg  20 mg Oral QAC lunch Shari Prows, MD       Or  . OLANZapine (ZYPREXA) injection 10 mg  10 mg Intramuscular QAC lunch Shari Prows, MD   10 mg at 01/19/16 0955  . traZODone (DESYREL) tablet 150 mg  150 mg Oral QHS PRN Jolanta B Pucilowska, MD        Lab Results: No results found for this or any previous visit (from the past 48 hour(s)).  Physical Findings: AIMS: Facial and Oral Movements Muscles of Facial Expression: None, normal Lips and Perioral Area: None, normal Jaw: None, normal Tongue: None, normal,Extremity Movements Upper (arms, wrists, hands, fingers): None, normal Lower (legs, knees, ankles, toes): None, normal, Trunk Movements Neck, shoulders, hips: None, normal, Overall Severity Severity of abnormal movements (highest score from  questions above): None, normal Incapacitation due to abnormal movements: None, normal Patient's awareness of abnormal movements (rate only patient's report): No Awareness, Dental Status Current problems with teeth and/or dentures?: No Does patient usually wear dentures?: No  CIWA:    COWS:     Musculoskeletal: Strength & Muscle Tone: within normal limits Gait & Station: normal Patient leans: N/A  Psychiatric Specialty Exam: Review of Systems  All other systems reviewed and are negative.   Blood pressure 108/76, pulse 89, resp. rate 18, SpO2 100 %.There is no weight on file to calculate BMI.  General Appearance: Fairly Groomed  Patent attorney::  Good  Speech:  Pressured  Volume:  Increased  Mood:  Angry, Dysphoric and Irritable  Affect:  Inappropriate and Labile  Thought Process:  Disorganized  Orientation:  Full (Time, Place, and Person)  Thought Content:  Delusions, Hallucinations: Auditory and Paranoid Ideation  Suicidal Thoughts:  No  Homicidal Thoughts:  No  Memory:  Immediate;   Fair Recent;   Fair Remote;   Fair  Judgement:  Poor  Insight:  Lacking  Psychomotor Activity:  Increased  Concentration:  Fair  Recall:  [employer] of Knowledge:Fair  Language: Fair  Akathisia:  No  Handed:  Right  AIMS (if indicated):     Assets:  Communication Skills Desire for Improvement Financial Resources/Insurance Housing Physical Health Resilience  ADL's:  Intact  Cognition: WNL  Sleep:  Number of Hours: 4.75   Treatment Plan Summary: Daily contact with patient to assess and evaluate symptoms and progress in treatment and Medication management   Ms. Butchko is a 65 year old female with a history of psychosis most likely schizoaffective disorder admitted for disorganized, agitated and bizarre behavior.  1. Mood/Psychosis. As the patient has been refusing oral medications we ordered forced medications and will continue Zyprexa oral or injections. Ativan is also available. We  started Lithium upon her request but she changed her mind and refused.   2. Coronary artery disease. We continued ASA.  3. Insomnia. She slept 9 hours. We continued trazodone.   4. Metabolic syndrome. Lipid panel, hemoglobin A1c and prolactin were ordered but the patient refused.   5. Disposition. She will return to home. She will follow up with [facility] in [place].  [name] 01/19/2016, 2:19 PM

## 2016-01-19 NOTE — Progress Notes (Signed)
Patient ID: Jennifer Leon, female   DOB: 12-13-1951, 65 y.o.   MRN: [id number]  CSW attempted to complete PSA. Pt refused. CSW will attempt at a later time.   Daisy Floro Liya Strollo MSW, LCSWA  01/19/2016 3:46 PM

## 2016-01-19 NOTE — Progress Notes (Signed)
D: Pt denies SI/HI/AVH. Pt is pleasant and cooperative. Pt stated she feels better from taking her medication, she appears less anxious and is interacting with peers and staff appropriately.  A: Pt was offered support and encouragement.  Pt was encouraged to attend groups. Q 15 minute checks were done for safety.  R:Pt attends groups and interacts well with peers and staff. Pt is taking medication. Pt has no complaints.Pt receptive to treatment and safety maintained on unit.

## 2016-01-19 NOTE — [facility] Group Notes (Signed)
[facility] LCSW Aftercare Discharge Planning Group Note   01/19/2016 10:48 AM (late entry 01/18/2016)   Participation Quality:  Did not attend   Ed Rayson L Natiya Seelinger MSW, LCSWA

## 2016-01-19 NOTE — Plan of Care (Signed)
Problem: Alteration in thought process Goal: LTG-Patient behavior demonstrates decreased signs psychosis (Patient behavior demonstrates decreased signs of psychosis to the point the patient is safe to return home and continue treatment in an outpatient setting.)  Outcome: Progressing Patient has decreased psychosis.

## 2016-01-19 NOTE — [facility] Group Notes (Signed)
[facility] LCSW Group Therapy  01/19/2016 1:39 PM  Type of Therapy:  Group Therapy  Participation Level:  Did Not Attend  Modes of Intervention:  Discussion, Education, Socialization and Support  Summary of Progress/Problems: Feelings around Relapse. Group members discussed the meaning of relapse and shared personal stories of relapse, how it affected them and others, and how they perceived themselves during this time. Group members were encouraged to identify triggers, warning signs and coping skills used when facing the possibility of relapse. Social supports were discussed and explored in detail.    Savian Mazon L Vale Peraza MSW, LCSWA  01/19/2016, 1:39 PM   

## 2016-01-19 NOTE — [facility] Group Notes (Signed)
[facility] LCSW Group Therapy  01/19/2016 10:23 AM (late entry for 01/18/16)   Type of Therapy:  Group Therapy  Participation Level:  Active  Participation Quality: Intrusive   Affect:  Irritable  Cognitive:  Alert  Insight:  Limited  Engagement in Therapy:  Limited  Modes of Intervention:  Discussion, Education, Socialization and Support  Summary of Progress/Problems: Balance in life: Patients will discuss the concept of balance and how it looks and feels to be unbalanced. Pt will identify areas in their life that is unbalanced and ways to become more balanced.  Zakkiyya was very irritable in group. She only wanted to discuss her legal rights to refuse treatments and how the states of [place] says she did not need mental health treatment. She states many times that she will not take her medications even though we were not discussing medications. CSW attempted to redirect her but she would not stay on topic and was disruptive to other group members. She was asked to leave group. She left when asked.   Lonza Shimabukuro L Rook Maue MSW, LCSWA  01/19/2016, 10:23 AM

## 2016-01-19 NOTE — Progress Notes (Signed)
Patient emerged from her room yelling and cursing about how we were trying to poison her on her way to the dayroom to get her dinner tray.  Patient ate all of her dinner except for her roll and yelled at the MHT's that they were trying to poison her and she was allergic to bread and she was going to take them to court.  Patient continued to pace the hallways and yell and curse.  Patient tore her name tag off her door as her agitation continued to escalate.  Patient continues to refuse any and all po medications at this time.  It was felt necessary to administer Ativan IM as per MD orders.

## 2016-01-19 NOTE — Plan of Care (Signed)
Problem: Ineffective individual coping Goal: STG: Patient will remain free from self harm Outcome: Progressing Patient has remained free from self harm to date

## 2016-01-19 NOTE — Progress Notes (Signed)
D:  Patient is alert today.  Patient is not oriented.  Patient is isolating in her room.  Patient is not attending or participating in groups currently.  Patient does not go to the dayroom for meals or snacks. A:  Scheduled medication administered to patient per MD orders.  Patient was not cooperative with po medication administration and refused all po meds.  Alternative medication injection was administered as per MD orders. Patient is informed to notify staff with any questions or concerns. R:  No adverse medication reactions were noted.  Patient was cooperative with ordered injection.  Patient does not interact with others and does not cooperative with assessment interview today.  Patient remains safe on the unit at this time.

## 2016-01-19 NOTE — Plan of Care (Signed)
Problem: Ineffective individual coping Goal: LTG: Patient will report a decrease in negative feelings Outcome: Not Progressing Patient still is expressing negative feelings

## 2016-01-20 NOTE — Progress Notes (Signed)
Quiet evening, no bizarre behavior, affect is blunted, less irritable and receptive to treatment plan. Patient paced back and forth for while and eventually went to bed to sleep. No acute distress noted, 15 minutes check maintained, will continue to monitor.

## 2016-01-20 NOTE — Progress Notes (Signed)
[facility] MD Progress Note  01/20/2016 1:53 PM Jennifer Leon  MRN:  [id number]  Subjective:  Jennifer Leon has a history of bipolar illness admitted in a manic psychotic episode after picked up by police [place] down the highway. She refuses oral medications and has been on forced medications. I will went to patient's room as nurses were apparent to give her an injection and she is refused her oral medications today. Patient stated some reference to me not understanding "oil." She stated that she has "power of attorney" in many states. She then in reference to the medication and stated "you're killing me" and reference that writer was killing "all of [place]."  Principal Problem: Schizoaffective disorder, bipolar type (HCC) Diagnosis:   Patient Active Problem List   Diagnosis Date Noted  . Schizoaffective disorder, bipolar type (HCC) [F25.0] 01/14/2016   Total Time spent with patient: 20 minutes  Past Psychiatric History: bipolar disorder.  Past Medical History:  Past Medical History  Diagnosis Date  . Hypertension   . Anxiety   . GERD (gastroesophageal reflux disease)   . Noncompliance with medication regimen   . PTSD (post-traumatic stress disorder)   . Schizoaffective disorder (HCC)   . Psychosis   . Paranoid delusion [facility])     Past Surgical History  Procedure Laterality Date  . Eye surgery    . Abdominal surgery      blockage  . Bladder surgery     Family History: History reviewed. No pertinent family history. Family Psychiatric  History: none reported. Social History:  History  Alcohol Use No     History  Drug Use No    Social History   Social History  . Marital Status: Married    Spouse Name: N/A  . Number of Children: N/A  . Years of Education: N/A   Social History Main Topics  . Smoking status: Former Games developer  . Smokeless tobacco: None  . Alcohol Use: No  . Drug Use: No  . Sexual Activity: Not Asked   Other Topics Concern  . None   Social History Narrative    Additional Social History:    Pain Medications: See PTA meds Prescriptions: See PTA meds Over the Counter: See PTA meds History of alcohol / drug use?: No history of alcohol / drug abuse                    Sleep: Fair  Appetite:  Fair  Current Medications: Current Facility-Administered Medications  Medication Dose Route Frequency Provider Last Rate Last Dose  . acetaminophen (TYLENOL) tablet 650 mg  650 mg Oral Q6H PRN Jennifer B Pucilowska, MD      . aspirin chewable tablet 162 mg  162 mg Oral Daily Shari Prows, MD   162 mg at 01/15/16 0924  . LORazepam (ATIVAN) tablet 2 mg  2 mg Oral Q2H PRN Shari Prows, MD       Or  . LORazepam (ATIVAN) injection 2 mg  2 mg Intramuscular Q4H PRN Shari Prows, MD   2 mg at 01/19/16 1714  . magnesium hydroxide (MILK OF MAGNESIA) suspension 30 mL  30 mL Oral Daily PRN Jennifer B Pucilowska, MD      . OLANZapine zydis (ZYPREXA) disintegrating tablet 20 mg  20 mg Oral QAC lunch Shari Prows, MD       Or  . OLANZapine (ZYPREXA) injection 10 mg  10 mg Intramuscular QAC lunch Jennifer B Pucilowska, MD   10 mg at [facility]  1206  . traZODone (DESYREL) tablet 150 mg  150 mg Oral QHS PRN Jennifer B Pucilowska, MD        Lab Results: No results found for this or any previous visit (from the past 48 hour(s)).  Physical Findings: AIMS: Facial and Oral Movements Muscles of Facial Expression: None, normal Lips and Perioral Area: None, normal Jaw: None, normal Tongue: None, normal,Extremity Movements Upper (arms, wrists, hands, fingers): None, normal Lower (legs, knees, ankles, toes): None, normal, Trunk Movements Neck, shoulders, hips: None, normal, Overall Severity Severity of abnormal movements (highest score from questions above): None, normal Incapacitation due to abnormal movements: None, normal Patient's awareness of abnormal movements (rate only patient's report): No Awareness, Dental Status Current problems  with teeth and/or dentures?: No Does patient usually wear dentures?: No  CIWA:    COWS:     Musculoskeletal: Strength & Muscle Tone: within normal limits Gait & Station: normal Patient leans: N/A  Psychiatric Specialty Exam: Review of Systems  All other systems reviewed and are negative.   Blood pressure 141/89, pulse 87, temperature 98.4 F (36.9 C), resp. rate 18, SpO2 100 %.There is no weight on file to calculate BMI.  General Appearance: Fairly Groomed  Patent attorney::  Good  Speech:  Pressured  Volume:  Increased  Mood:  Angry, Dysphoric and Irritable  Affect:  Inappropriate and Labile  Thought Process:  Disorganized  Orientation:  Full (Time, Place, and Person)  Thought Content:  Delusions, Hallucinations: Auditory and Paranoid Ideation  Suicidal Thoughts:  No  Homicidal Thoughts:  No  Memory:  Immediate;   Fair Recent;   Fair Remote;   Fair  Judgement:  Poor  Insight:  Lacking  Psychomotor Activity:  Increased  Concentration:  Fair  Recall:  [employer] of Knowledge:Fair  Language: Fair  Akathisia:  No  Handed:  Right  AIMS (if indicated):     Assets:  Communication Skills Desire for Improvement Financial Resources/Insurance Housing Physical Health Resilience  ADL's:  Intact  Cognition: WNL  Sleep:  Number of Hours: 4.75   Treatment Plan Summary: Daily contact with patient to assess and evaluate symptoms and progress in treatment and Medication management   Jennifer Leon is a 65 year old female with a history of psychosis most likely schizoaffective disorder admitted for disorganized, agitated and bizarre behavior.  1. Mood/Psychosis. As the patient has been refusing oral medications we ordered forced medications and will continue Zyprexa oral or injections. Ativan is also available. We started Lithium upon her request but she changed her mind and refused.   2. Coronary artery disease. We continued ASA.  3. Insomnia. She slept 9 hours. We continued  trazodone.   4. Metabolic syndrome. Lipid panel, hemoglobin A1c and prolactin were ordered but the patient refused.   5. Disposition. She will return to home. She will follow up with [facility] in [place].  [name] 01/20/2016, 1:53 PM

## 2016-01-20 NOTE — [facility] Group Notes (Signed)
[facility] LCSW Group Therapy  01/20/2016 3:05 PM  Type of Therapy:  Group Therapy  Participation Level:  Minimal  Participation Quality:  Intrusive  Affect: Irritable   Cognitive:  Alert  Insight:  Limited  Engagement in Therapy:  Limited  Modes of Intervention:  Discussion, Education, Socialization and Support  Summary of Progress/Problems:  Mindfulness: Patient discussed mindfulness and relaxing techniques and why they are beneficial. Pt discussed ways to incorporate mindfulness in their lives. Pt practiced a mindfulness techique and discussed how it made them feel. Pt refused to discuss the topic. She only wanted to talk about not wanting to take her medications. CSW attempted to redirect but she became angry and stormed out of group. After group she apologized to some of the patients.   [employer] MSW, LCSWA  01/20/2016, 3:05 PM

## 2016-01-20 NOTE — Plan of Care (Signed)
Problem: Alteration in thought process Goal: LTG-Patient behavior demonstrates decreased signs psychosis (Patient behavior demonstrates decreased signs of psychosis to the point the patient is safe to return home and continue treatment in an outpatient setting.)  Outcome: Progressing Decreased psychosis noted.      

## 2016-01-20 NOTE — Progress Notes (Signed)
Pt has been seclusive to her room most of the day. Pt continues to need IM meds because she continues to refuse her po meds. Pt has been compliant with taking her IM injections of olanzapine without any resistance. Pt has been able to attend some groups but was unable to still the entire time.

## 2016-01-21 NOTE — Plan of Care (Signed)
Problem: Ineffective individual coping Goal: STG: Patient will remain free from self harm Outcome: Progressing Pt remains free from harm     

## 2016-01-21 NOTE — Progress Notes (Signed)
Recreation Therapy Notes  Date: 01.23.17 Time: 3:00 pm Location: Craft Room  Group Topic: Self-expression  Goal Area(s) Addresses:  Patient will effectively use art as a means of self-expression. Patient will recognize positive benefit of self-expression. Patient will be able to identify one emotion experienced during group session. Patient will identify use of art as a coping skill.  Behavioral Response: Inattentive, Left early  Intervention: Two Faces of Me  Activity: Patients were given a blank face worksheet and instructed to draw or write on one half of the face how they were feeling when they were admitted and draw or write on the other half how they want to feel when they are d/c.  Education: LRT educated patients on different forms of self-expression.  Education Outcome: Patient left before LRT educated group.  Clinical Observations/Feedback: Patient wrote on the back of her worksheet. Patient left group at approximately 3:25 pm. Patient did not return to group.  Jacquelynn Cree, LRT/CTRS 01/21/2016 4:06 PM

## 2016-01-21 NOTE — Progress Notes (Signed)
D: Pt continues to be seclusive to her room this evening. Does not require PRN injections at this time. No concerns or complaints at this time. A: Emotional support and encouragement provided. q15 minute safety checks maintained. R: Pt remains free from harm

## 2016-01-21 NOTE — Progress Notes (Signed)
[facility] MD Progress Note  01/21/2016 3:43 PM Jennifer Leon  MRN:  [id number]  Subjective:  Jennifer Leon is again angry and noncooperative today. She argues that there is no mental illness and that we are trying to kill her. She denies any symptoms of depression anxiety or psychosis. She refuses oral medications that takes Zyprexa injections with no problems. She promises not to take any medications at home. She participates well in programming. Unfortunately she is still unable to participate in discharge planning. We are uncertain if the patient has an apartment to go to or if she needs to be placed. There are no somatic complaints.  Principal Problem: Schizoaffective disorder, bipolar type (HCC) Diagnosis:   Patient Active Problem List   Diagnosis Date Noted  . Schizoaffective disorder, bipolar type (HCC) [F25.0] 01/14/2016   Total Time spent with patient: 20 minutes  Past Psychiatric History: Bipolar disorder.  Past Medical History:  Past Medical History  Diagnosis Date  . Hypertension   . Anxiety   . GERD (gastroesophageal reflux disease)   . Noncompliance with medication regimen   . PTSD (post-traumatic stress disorder)   . Schizoaffective disorder (HCC)   . Psychosis   . Paranoid delusion [facility])     Past Surgical History  Procedure Laterality Date  . Eye surgery    . Abdominal surgery      blockage  . Bladder surgery     Family History: History reviewed. No pertinent family history. Family Psychiatric  History: None reported. Social History:  History  Alcohol Use No     History  Drug Use No    Social History   Social History  . Marital Status: Married    Spouse Name: N/A  . Number of Children: N/A  . Years of Education: N/A   Social History Main Topics  . Smoking status: Former Games developer  . Smokeless tobacco: None  . Alcohol Use: No  . Drug Use: No  . Sexual Activity: Not Asked   Other Topics Concern  . None   Social History Narrative   Additional Social  History:    Pain Medications: See PTA meds Prescriptions: See PTA meds Over the Counter: See PTA meds History of alcohol / drug use?: No history of alcohol / drug abuse                    Sleep: Fair  Appetite:  Fair  Current Medications: Current Facility-Administered Medications  Medication Dose Route Frequency Provider Last Rate Last Dose  . acetaminophen (TYLENOL) tablet 650 mg  650 mg Oral Q6H PRN Alfred Harrel B Morrill Bomkamp, MD      . aspirin chewable tablet 162 mg  162 mg Oral Daily Lenin Kuhnle B Daylan Juhnke, MD   162 mg at 01/21/16 1038  . LORazepam (ATIVAN) tablet 2 mg  2 mg Oral Q2H PRN Shari Prows, MD       Or  . LORazepam (ATIVAN) injection 2 mg  2 mg Intramuscular Q4H PRN Shari Prows, MD   2 mg at 01/19/16 1714  . magnesium hydroxide (MILK OF MAGNESIA) suspension 30 mL  30 mL Oral Daily PRN Vittorio Mohs B Michell Kader, MD      . OLANZapine zydis (ZYPREXA) disintegrating tablet 20 mg  20 mg Oral QAC lunch Shari Prows, MD       Or  . OLANZapine (ZYPREXA) injection 10 mg  10 mg Intramuscular QAC lunch Nyala Kirchner B Cayce Quezada, MD   10 mg at 01/21/16 1244  . traZODone (DESYREL)  tablet 150 mg  150 mg Oral QHS PRN Shari Prows, MD        Lab Results: No results found for this or any previous visit (from the past 48 hour(s)).  Physical Findings: AIMS: Facial and Oral Movements Muscles of Facial Expression: None, normal Lips and Perioral Area: None, normal Jaw: None, normal Tongue: None, normal,Extremity Movements Upper (arms, wrists, hands, fingers): None, normal Lower (legs, knees, ankles, toes): None, normal, Trunk Movements Neck, shoulders, hips: None, normal, Overall Severity Severity of abnormal movements (highest score from questions above): None, normal Incapacitation due to abnormal movements: None, normal Patient's awareness of abnormal movements (rate only patient's report): No Awareness, Dental Status Current problems with teeth and/or  dentures?: No Does patient usually wear dentures?: No  CIWA:    COWS:     Musculoskeletal: Strength & Muscle Tone: within normal limits Gait & Station: normal Patient leans: N/A  Psychiatric Specialty Exam: Review of Systems  All other systems reviewed and are negative.   Blood pressure 126/78, pulse 70, temperature 98.2 F (36.8 C), temperature source Oral, resp. rate 18, SpO2 100 %.There is no weight on file to calculate BMI.  General Appearance: Disheveled  Eye Contact::  Minimal  Speech:  Pressured  Volume:  Increased  Mood:  Angry, Dysphoric and Irritable  Affect:  Congruent  Thought Process:  Disorganized  Orientation:  Full (Time, Place, and Person)  Thought Content:  Delusions, Hallucinations: Auditory and Paranoid Ideation  Suicidal Thoughts:  No  Homicidal Thoughts:  No  Memory:  Immediate;   Fair Recent;   Fair Remote;   Fair  Judgement:  Impaired  Insight:  Lacking  Psychomotor Activity:  Increased  Concentration:  Fair  Recall:  [employer] of Knowledge:Fair  Language: Fair  Akathisia:  No  Handed:  Right  AIMS (if indicated):     Assets:  Communication Skills Desire for Improvement Financial Resources/Insurance Physical Health Resilience  ADL's:  Intact  Cognition: WNL  Sleep:  Number of Hours: 5   Treatment Plan Summary: Daily contact with patient to assess and evaluate symptoms and progress in treatment and Medication management   Jennifer Leon is a 65 year old female with a history of psychosis most likely schizoaffective disorder admitted for disorganized, agitated and bizarre behavior.  1. Mood/Psychosis. As the patient has been refusing oral medications we ordered forced medications and will continue Zyprexa oral or injections. Ativan is also available. We started Lithium upon her request but she changed her mind and refused.   2. Coronary artery disease. We continued ASA.  3. Insomnia. She slept 5 hours. We continued trazodone.   4.  Metabolic syndrome. Lipid panel, hemoglobin A1c and prolactin were ordered but the patient refused.   5. Disposition. TBE. She will likely return to home. She will follow up with [facility] in [place].   Rhea Thrun 01/21/2016, 3:43 PM

## 2016-01-22 NOTE — Progress Notes (Signed)
D: Patient remains disorganized. She stated "they took my cat and that's how I ended up here" during assessment. She denies SI/HI/AVH. She also denies pain. She's seen somewhat in the milieu but was in bed during assessment.  A: Patient did not receive any HS medication. Encouragement was provided.  R: Patient remained calm and cooperative. Safety maintained with 15 min checks.

## 2016-01-22 NOTE — Plan of Care (Signed)
Problem: Alteration in thought process Goal: LTG-Patient verbalizes understanding importance med regimen (Patient verbalizes understanding of importance of medication regimen and need to continue outpatient care.)  Outcome: Not Progressing States that she does not need any medicines.

## 2016-01-22 NOTE — [facility] Group Notes (Signed)
[facility] Group Notes:  (Nursing/MHT/Case Management/Adjunct)  Date:  01/22/2016  Time:  1:45 PM  Type of Therapy:  Psychoeducational Skills  Participation Level:  Did Not Attend   Darrow Bussing 01/22/2016, 1:45 PM

## 2016-01-22 NOTE — [facility] Group Notes (Signed)
[facility] LCSW Group Therapy  01/22/2016 4:58 PM  Type of Therapy:  Group Therapy  Participation Level:  Did Not Attend    Janella Rogala P, MSW, LCSW 01/22/2016, 4:58 PM  

## 2016-01-22 NOTE — Plan of Care (Signed)
Problem: Alteration in thought process Goal: LTG-Patient is able to perceive the environment accurately Outcome: Progressing When looking to see what time it is, patient knows to come to the nurses station to look.

## 2016-01-22 NOTE — [facility] Counselor (Signed)
Patient remains psychotic and too acute to participate in assessment today Tuesday 01/22/16.

## 2016-01-22 NOTE — Progress Notes (Signed)
Recreation Therapy Notes  Date: 01.24.17 Time: 3:00 pm Location: Craft Room  Group Topic: Goal Setting  Goal Area(s) Addresses:  Patient will write at least one goal. Patient will write at least one obstacle.  Behavioral Response: Did not attend  Intervention: Recovery Goal Chart  Activity: Patients were instructed to make a Recovery Goal Chart including goals, obstacles, the date they started working on their goals, and the date they achieved their goal.  Education: LRT educated patients on healthy ways they can celebrate reaching their goals.  Education Outcome: Patient did not attend group.  Clinical Observations/Feedback: Patient did not attend group.  Jacquelynn Cree, LRT/CTRS 01/22/2016 4:22 PM

## 2016-01-22 NOTE — [facility] Counselor (Signed)
Patient has improved some but still too disorganized to assess. CSW was able to get very little information from patient that sounds like a good discharge plan. PSA is not possible at this time but will try again tomorrow Tuesday 01/22/16.

## 2016-01-22 NOTE — Progress Notes (Signed)
[facility] MD Progress Note  01/22/2016 6:06 PM Jennifer Leon  MRN:  [id number]  Subjective:  Jennifer Leon is  Loud, agitated, uncooprative, disruptive in the milieu. She is still unable t participate in discharge planning. With intent to ask the police to go to her house to see if we can get any inormation about her living arragements.  Principal Problem: Schizoaffective disorder, bipolar type (HCC) Diagnosis:   Patient Active Problem List   Diagnosis Date Noted  . Schizoaffective disorder, bipolar type (HCC) [F25.0] 01/14/2016   Total Time spent with patient: 15 minutes  Past Psychiatric History:  Bipolar dsorder.  Past Medical History:  Past Medical History  Diagnosis Date  . Hypertension   . Anxiety   . GERD (gastroesophageal reflux disease)   . Noncompliance with medication regimen   . PTSD (post-traumatic stress disorder)   . Schizoaffective disorder (HCC)   . Psychosis   . Paranoid delusion [facility])     Past Surgical History  Procedure Laterality Date  . Eye surgery    . Abdominal surgery      blockage  . Bladder surgery     Family History: History reviewed. No pertinent family history. Family Psychiatric  History:  nonereported. Social History:  History  Alcohol Use No     History  Drug Use No    Social History   Social History  . Marital Status: Married    Spouse Name: N/A  . Number of Children: N/A  . Years of Education: N/A   Social History Main Topics  . Smoking status: Former Games developer  . Smokeless tobacco: None  . Alcohol Use: No  . Drug Use: No  . Sexual Activity: Not Asked   Other Topics Concern  . None   Social History Narrative   Additional Social History:    Pain Medications: See PTA meds Prescriptions: See PTA meds Over the Counter: See PTA meds History of alcohol / drug use?: No history of alcohol / drug abuse                    Sleep: Fair  Appetite:  Fair  Current Medications: Current Facility-Administered Medications   Medication Dose Route Frequency Provider Last Rate Last Dose  . acetaminophen (TYLENOL) tablet 650 mg  650 mg Oral Q6H PRN Brooke Payes B Jacklyn Branan, MD      . aspirin chewable tablet 162 mg  162 mg Oral Daily Sonnia Strong B Rainee Sweatt, MD   162 mg at 01/21/16 1038  . LORazepam (ATIVAN) tablet 2 mg  2 mg Oral Q2H PRN Shari Prows, MD       Or  . LORazepam (ATIVAN) injection 2 mg  2 mg Intramuscular Q4H PRN Shari Prows, MD   2 mg at 01/19/16 1714  . magnesium hydroxide (MILK OF MAGNESIA) suspension 30 mL  30 mL Oral Daily PRN Ante Arredondo B Jago Carton, MD      . OLANZapine zydis (ZYPREXA) disintegrating tablet 20 mg  20 mg Oral QAC lunch Shari Prows, MD       Or  . OLANZapine (ZYPREXA) injection 10 mg  10 mg Intramuscular QAC lunch Hasini Peachey B Ivis Henneman, MD   10 mg at 01/22/16 1146  . traZODone (DESYREL) tablet 150 mg  150 mg Oral QHS PRN Christin Moline B Necha Harries, MD        Lab Results: No results found for this or any previous visit (from the past 48 hour(s)).  Physical Findings: AIMS: Facial and Oral Movements Muscles of Facial Expression:  None, normal Lips and Perioral Area: None, normal Jaw: None, normal Tongue: None, normal,Extremity Movements Upper (arms, wrists, hands, fingers): None, normal Lower (legs, knees, ankles, toes): None, normal, Trunk Movements Neck, shoulders, hips: None, normal, Overall Severity Severity of abnormal movements (highest score from questions above): None, normal Incapacitation due to abnormal movements: None, normal Patient's awareness of abnormal movements (rate only patient's report): No Awareness, Dental Status Current problems with teeth and/or dentures?: No Does patient usually wear dentures?: No  CIWA:    COWS:     Musculoskeletal: Strength & Muscle Tone: within normal limits Gait & Station: normal Patient leans: N/A  Psychiatric Specialty Exam: Review of Systems  All other systems reviewed and are negative.   Blood pressure  140/98, pulse 69, temperature 98.2 F (36.8 C), temperature source Oral, resp. rate 18, height 5' 4.96" (1.65 m), weight 55.339 kg (122 lb), SpO2 100 %.Body mass index is 20.33 kg/(m^2).  General Appearance: Fairly Groomed  Patent attorney::  Fair  Speech:  Pressured  Volume:  Increased  Mood:  Angry, Dysphoric and Irritable  Affect:  Labile  Thought Process:  Disorganized  Orientation:  Full (Time, Place, and Person)  Thought Content:  Delusions and Paranoid Ideation  Suicidal Thoughts:  No  Homicidal Thoughts:  No  Memory:  Immediate;   Fair Recent;   Fair Remote;   Fair  Judgement:  Poor  Insight:  Lacking  Psychomotor Activity:  Increased  Concentration:  Fair  Recall:  [employer] of Knowledge:Fair  Language: Fair  Akathisia:  No  Handed:  Right  AIMS (if indicated):     Assets:  Communication Skills Desire for Improvement Physical Health Resilience  ADL's:  Intact  Cognition: WNL  Sleep:  Number of Hours: 6.75   Treatment Plan Summary: Daily contact with patient to assess and evaluate symptoms and progress in treatment and Medication management   Jennifer Leon is a 65 year old female with a history of psychosis most likely schizoaffective disorder admitted for disorganized, agitated and bizarre behavior.  1. Mood/Psychosis. As the patient has been refusing oral medications we ordered forced medications and will continue Zyprexa oral or injections. Ativan is also available. We started Lithium upon her request but she changed her mind and refused.   2. Coronary artery disease. We continued ASA.  3. Insomnia. She slept 5 hours. We continued trazodone.   4. Metabolic syndrome. Lipid panel, hemoglobin A1c and prolactin were ordered but the patient refused.   5. Disposition. TBE. She will likely return to home. She will follow up with [facility] in [place].  Jakevious Hollister 01/22/2016, 6:06 PM

## 2016-01-22 NOTE — Progress Notes (Signed)
Patient was angry about the medications states "come and stick me,I don't need these medications."She was talking to herself loud in her room.In & out of room.Minimal interactions with peers & staff.Attended groups.Appetite good.

## 2016-01-23 LAB — HEMOGLOBIN A1C: Hgb A1c MFr Bld: 5.3 % (ref 4.0–6.0)

## 2016-01-23 LAB — LIPID PANEL
Cholesterol: 172 mg/dL (ref 0–200)
HDL: 60 mg/dL (ref >40)
LDL CALC: 65 mg/dL (ref 0–99)
TRIGLYCERIDES: 233 mg/dL — AB (ref <150)
Total CHOL/HDL Ratio: 2.9 RATIO
VLDL: 47 mg/dL — AB (ref 0–40)

## 2016-01-23 LAB — TSH: TSH: 2.001 u[IU]/mL (ref 0.350–4.500)

## 2016-01-23 NOTE — Progress Notes (Signed)
D: Patient remains disorganized. She stated she doesn't want to go to groups because people keep telling her she's being disruptive. She denies SI/HI/AVH. She also denies pain. She's been seen in the milieu talking to staff and patients.  A: Patient did not receive any HS medication. Encouragement was provided.  R: Patient remained calm and cooperative. Safety maintained with 15 min checks.

## 2016-01-23 NOTE — [facility] Group Notes (Signed)
[facility] LCSW Aftercare Discharge Planning Group Note   01/23/2016 10:56 AM  Participation Quality:  Did not attend   Jennifer Leon L Nailyn Dearinger MSW, LCSWA    

## 2016-01-23 NOTE — Tx Team (Signed)
Interdisciplinary Treatment Plan Update (Adult)  Date:  01/23/2016 Time Reviewed:  10:36 AM  Progress in Treatment: Attending groups: Yes. Participating in groups:  Yes. Taking medication as prescribed:  Yes. Patient on forced medications Tolerating medication:  Yes. Family/Significant othe contact made:  No, will contact:  if patient provides cosnent Patient understands diagnosis:  No. Discussing patient identified problems/goals with staff:  Yes. Medical problems stabilized or resolved:  Yes. Denies suicidal/homicidal ideation: Yes. Issues/concerns per patient self-inventory:  No. Other:  New problem(s) identified: No, Describe:  none reported  Discharge Plan or Barriers:Patient will stabilize on medications and discharge home. Patient will follow up with her outpatient provider at discharge for medication management and therapy.   Reason for Continuation of Hospitalization: Homicidal ideation Mania  Comments: Patient will be [facility] to [facility] and currently cannot provide any living person to contact outside of the hospital. [place] will also call [place] police to do a well check on patient residence.  Estimated length of stay: up to 4 days expected discharge Friday 01/25/16  New goal(s):  Review of initial/current patient goals per problem list:   1.  Goal(s):participate in aftercare planning  Met:  No  Target date:by discharge  As evidenced [id number] participation in aftercare plan AEB aftercare provider and housing plan at discharge being identified  2.  Goal (s):mania will become manageable  Met:  No  Target date:by discharge  As evidenced [id number] demonstrates decreased signs and symptoms of mania   Attendees: Physician:  Orson Slick, MD 1/25/201710:20 AM  Nursing:   Tyler Pita, RN 1/25/201710:20 AM  Other:  Carmell Austria, LCSWA 1/25/201710:20 AM  Other:  Dossie Arbour, LCSW 1/25/201710:20 AM  Other:  Marylou Flesher, LCSWA 1/25/201710:20 AM  Other:  Everitt Amber, LRT 1/25/201710:20 AM  Other: Levada Schilling, PA Student 1/25/201710:20 AM  Other:  1/25/201710:20 AM  Other:  1/25/201710:20 AM  Other:  1/25/201710:20 AM  Other:  1/25/201710:20 AM  Other:   1/25/201710:20 AM    Scribe for Treatment Team:   Keene Breath, MSW, LCSWA [phone]  01/23/2016, 10:36 AM

## 2016-01-23 NOTE — Plan of Care (Signed)
Problem: Alteration in thought process Goal: STG-Patient is able to discuss thoughts with staff Outcome: Progressing Patient told me about her day and talked about group.

## 2016-01-23 NOTE — Progress Notes (Signed)
Patient with flat affect, cooperative behavior with meals and meds. Verbalizes needs appropriately with staff this am, minimal interaction with peers. Remains hyperverbal and irritable. States "I am leaving today". Good adls. No SI/HI/AVH at this time. Safety maintained.

## 2016-01-23 NOTE — Progress Notes (Signed)
Patient remains paranoid, gives MD a folder piece of paper stating it is a bomb. Patient with increasing agitation and yelling and pacing. Ativan IM prn administered with good effect.

## 2016-01-23 NOTE — [facility] Group Notes (Signed)
[facility] Group Notes:  (Nursing/MHT/Case Management/Adjunct)  Date:  01/23/2016  Time:  12:42 PM  Type of Therapy:  Group Therapy  Participation Level:  Minimal  Participation Quality:  Intrusive  Affect:  Defensive  Cognitive:  Disorganized  Insight:  Lacking  Engagement in Group:  Resistant  Modes of Intervention:  Clarification  Summary of Progress/Problems:  Mayra Neer 01/23/2016, 12:42 PM

## 2016-01-23 NOTE — Progress Notes (Signed)
[facility] MD Progress Note  01/23/2016 3:52 PM Jennifer Leon  MRN:  [id number]  Subjective:  Jennifer Leon is agitated, disruptive, intrusive, loud. She talks to her voices loudly in her room. She demands to be discharged. She is still unable to participate in discharge planning. She refuses oral medication and was given an injection of Ativan in addition to Zyprexa today.  Principal Problem: Schizoaffective disorder, bipolar type (HCC) Diagnosis:   Patient Active Problem List   Diagnosis Date Noted  . Schizoaffective disorder, bipolar type (HCC) [F25.0] 01/14/2016   Total Time spent with patient: 30 minutes  Past Psychiatric History: Bipolar disorder.  Past Medical History:  Past Medical History  Diagnosis Date  . Hypertension   . Anxiety   . GERD (gastroesophageal reflux disease)   . Noncompliance with medication regimen   . PTSD (post-traumatic stress disorder)   . Schizoaffective disorder (HCC)   . Psychosis   . Paranoid delusion [facility])     Past Surgical History  Procedure Laterality Date  . Eye surgery    . Abdominal surgery      blockage  . Bladder surgery     Family History: History reviewed. No pertinent family history. Family Psychiatric  History: None reported. Social History:  History  Alcohol Use No     History  Drug Use No    Social History   Social History  . Marital Status: Married    Spouse Name: N/A  . Number of Children: N/A  . Years of Education: N/A   Social History Main Topics  . Smoking status: Former Games developer  . Smokeless tobacco: None  . Alcohol Use: No  . Drug Use: No  . Sexual Activity: Not Asked   Other Topics Concern  . None   Social History Narrative   Additional Social History:    Pain Medications: See PTA meds Prescriptions: See PTA meds Over the Counter: See PTA meds History of alcohol / drug use?: No history of alcohol / drug abuse                    Sleep: Fair  Appetite:  Fair  Current Medications: Current  Facility-Administered Medications  Medication Dose Route Frequency Provider Last Rate Last Dose  . acetaminophen (TYLENOL) tablet 650 mg  650 mg Oral Q6H PRN Jolanta B Pucilowska, MD      . aspirin chewable tablet 162 mg  162 mg Oral Daily Shari Prows, MD   162 mg at 01/23/16 0821  . LORazepam (ATIVAN) tablet 2 mg  2 mg Oral Q2H PRN Shari Prows, MD       Or  . LORazepam (ATIVAN) injection 2 mg  2 mg Intramuscular Q4H PRN Shari Prows, MD   2 mg at 01/23/16 1258  . magnesium hydroxide (MILK OF MAGNESIA) suspension 30 mL  30 mL Oral Daily PRN Jolanta B Pucilowska, MD      . OLANZapine zydis (ZYPREXA) disintegrating tablet 20 mg  20 mg Oral QAC lunch Shari Prows, MD       Or  . OLANZapine (ZYPREXA) injection 10 mg  10 mg Intramuscular QAC lunch Jolanta B Pucilowska, MD   10 mg at 01/23/16 1143  . traZODone (DESYREL) tablet 150 mg  150 mg Oral QHS PRN Shari Prows, MD        Lab Results:  Results for orders placed or performed during the hospital encounter of 01/14/16 (from the past 48 hour(s))  Lipid panel  Status: Abnormal   Collection Time: 01/23/16 11:00 AM  Result Value Ref Range   Cholesterol 172 0 - 200 mg/dL   Triglycerides 161 (H) <150 mg/dL   HDL 60 >09 mg/dL   Total CHOL/HDL Ratio 2.9 RATIO   VLDL 47 (H) 0 - 40 mg/dL   LDL Cholesterol 65 0 - 99 mg/dL    Comment:        Total Cholesterol/HDL:CHD Risk Coronary Heart Disease Risk Table                     Men   Women  1/2 Average Risk   3.4   3.3  Average Risk       5.0   4.4  2 X Average Risk   9.6   7.1  3 X Average Risk  23.4   11.0        Use the calculated Patient Ratio above and the CHD Risk Table to determine the patient's CHD Risk.        ATP III CLASSIFICATION (LDL):  <100     mg/dL   Optimal  604-540  mg/dL   Near or Above                    Optimal  130-159  mg/dL   Borderline  981-191  mg/dL   High  >478     mg/dL   Very High   Hemoglobin A1c     Status: None    Collection Time: 01/23/16 11:00 AM  Result Value Ref Range   Hgb A1c MFr Bld 5.3 4.0 - 6.0 %  TSH     Status: None   Collection Time: 01/23/16 11:00 AM  Result Value Ref Range   TSH 2.001 0.350 - 4.500 uIU/mL    Physical Findings: AIMS: Facial and Oral Movements Muscles of Facial Expression: None, normal Lips and Perioral Area: None, normal Jaw: None, normal Tongue: None, normal,Extremity Movements Upper (arms, wrists, hands, fingers): None, normal Lower (legs, knees, ankles, toes): None, normal, Trunk Movements Neck, shoulders, hips: None, normal, Overall Severity Severity of abnormal movements (highest score from questions above): None, normal Incapacitation due to abnormal movements: None, normal Patient's awareness of abnormal movements (rate only patient's report): No Awareness, Dental Status Current problems with teeth and/or dentures?: No Does patient usually wear dentures?: No  CIWA:    COWS:     Musculoskeletal: Strength & Muscle Tone: within normal limits Gait & Station: normal Patient leans: N/A  Psychiatric Specialty Exam: Review of Systems  All other systems reviewed and are negative.   Blood pressure 133/78, pulse 69, temperature 98.2 F (36.8 C), temperature source Oral, resp. rate 18, height 5' 4.96" (1.65 m), weight 55.339 kg (122 lb), SpO2 100 %.Body mass index is 20.33 kg/(m^2).  General Appearance: Fairly Groomed  Patent attorney::  Minimal  Speech:  Pressured  Volume:  Increased  Mood:  Angry, Dysphoric and Irritable  Affect:  Inappropriate and Labile  Thought Process:  Disorganized  Orientation:  Full (Time, Place, and Person)  Thought Content:  Delusions, Hallucinations: Auditory and Paranoid Ideation  Suicidal Thoughts:  No  Homicidal Thoughts:  No  Memory:  Immediate;   Fair Recent;   Fair Remote;   Fair  Judgement:  Poor  Insight:  Lacking  Psychomotor Activity:  Increased  Concentration:  Poor  Recall:  Poor  Fund of Knowledge:Fair   Language: Fair  Akathisia:  No  Handed:  Right  AIMS (if indicated):  Assets:  Communication Skills Desire for Improvement Financial Resources/Insurance Physical Health Resilience  ADL's:  Intact  Cognition: WNL  Sleep:  Number of Hours: 6.15   Treatment Plan Summary: Daily contact with patient to assess and evaluate symptoms and progress in treatment and Medication management   Ms. Stallworth is a 65 year old female with a history of psychosis most likely schizoaffective disorder admitted for disorganized, agitated and bizarre behavior.  1. Mood/Psychosis. As the patient has been refusing oral medications we ordered forced medications and will continue Zyprexa oral or injections. Ativan is also available. We started Lithium upon her request but she changed her mind and refused.   2. Coronary artery disease. We continued ASA.  3. Insomnia. She slept 5 hours. We continued trazodone.   4. Metabolic syndrome. Lipid panel, hemoglobin A1c and prolactin are pending.    5. Disposition. TBE. She will likely return to home. She will follow up with [facility] in [place].   Jolanta Pucilowska 01/23/2016, 3:52 PM

## 2016-01-23 NOTE — BHH Counselor (Signed)
Adult Comprehensive Assessment  Patient ID: Jennifer Leon, female   DOB: [birth year], 65 y.o.   MRN: [id number]  Information Source: Information source: Patient  Current Stressors:     Living/Environment/Situation:  Living Arrangements: Other (Comment) (Bill Lussier-he went up [place] to see his doctor for his ear) Living conditions (as described by patient or guardian): PACT team in [place] maybe at [facility]  How long has patient lived in current situation?: 23 yrs What is atmosphere in current home: Loving  Family History:  Marital status: Married Number of Years Married: 23 Are you sexually active?: No Does patient have children?: No (we have pets)  Childhood History:  By whom was/is the patient raised?: Mother/father and step-parent Description of patient's relationship with caregiver when they were a child: lived with mom after divorce Does patient have siblings?: Yes Number of Siblings: 41 Description of patient's current relationship with siblings: broken  Was the patient ever a victim of a crime or a disaster?: No  Education:  Highest grade of school patient has completed: some college Currently a student?: No Learning disability?: No  Employment/Work Situation:   Employment situation: On disability How long has patient been on disability: since 1984 Patient's job has been impacted by current illness: Yes What is the longest time patient has a held a job?: 10 years Where was the patient employed at that time?:  [employer] Nov 08, 2010 last employment Has patient ever been in the [employer]?: No Has patient ever served in Buyer, retail?: No Did You Receive Any Psychiatric Treatment/Services While in Equities trader?: No Are There Guns or Education officer, community in Your Home?: No Are These Comptroller?:  (n/a)  Financial Resources:   Financial resources: Insurance claims handler Does patient have a Lawyer or guardian?: No  Alcohol/Substance Abuse:   What has been your  use of drugs/alcohol within the last 12 months?: denies If attempted suicide, did drugs/alcohol play a role in this?: No Alcohol/Substance Abuse Treatment Hx: Denies past history Has alcohol/substance abuse ever caused legal problems?: No  Social Support System:   Museum/gallery exhibitions officer System: church and family Type of faith/religion: Technical brewer here, [employer] up [employer] does patient's faith help to cope with current illness?: prayer and reading and attending services  Leisure/Recreation:   Leisure and Hobbies: puzzles, coloring, sketches, board games, swimming, crafts  Strengths/Needs:   What things does the patient do well?: advocating for myself In what areas does patient struggle / problems for patient: cooperation of others  Discharge Plan:   Does patient have access to transportation?: No Plan for no access to transportation at discharge: will need bus pass Will patient be returning to same living situation after discharge?: Yes Currently receiving community mental health services: No If no, would patient like referral for services when discharged?: Yes (What county?) ([facility] in [place]) Does patient have financial barriers related to discharge medications?: No  Summary/Recommendations:   Summary and Recommendations (to be completed by the evaluator): Patient is a married 65 yo Caucasian female admitted for psychosis is manic and was brought in by police who [place] side of the [place]. Patient is irritable and demanding with disorganized thoughts and not able to come up with a safe discharge plan for follow up care or hosuing at this time. Patient states she lives in an apartment in [place] but not able to provide any person to give collateral information as to patient's living arrangement. Patient remains disorganized and will  discharge once stabilized on medications. Patient plans to return home and will need  follow up outpatient care arranged. Pateint is encouraged to participate in medication management, group therapy, and therapeutic milieu.   Lulu Riding., MSW, [facility]ors [phone] 01/23/2016

## 2016-01-23 NOTE — BHH Group Notes (Signed)
BHH LCSW Group Therapy  01/23/2016 2:28 PM  Type of Therapy: Group Therapy  Participation Level: Did Not Attend  Modes of Intervention: Discussion, Education, Exploration, Socialization and Support  Summary of Progress/Problems: Self esteem: Patients discussed self esteem and how it impacts them. They discussed what aspects in their lives has influenced their self esteem. They were challenged to identify changes that are needed in order to improve self esteem.   Diasha Castleman L Nataliyah Packham MSW, LCSWA  01/23/2016, 2:28 PM 

## 2016-01-24 LAB — PROLACTIN: PROLACTIN: 19.4 ng/mL (ref 4.8–23.3)

## 2016-01-24 NOTE — Progress Notes (Signed)
Patient is labile and intrusive.  Refuses to take any medications by mouth will only take injection.  Speech is pressured at times  and loud and continues to be religiously preoccupied.  Support and encouragement offered.  Safety maintained.

## 2016-01-24 NOTE — Plan of Care (Signed)
Problem: Ineffective individual coping Goal: LTG: Patient will report a decrease in negative feelings Outcome: Not Progressing Patient is labile and religiously preoccupied.  Yells and cusses when informed that it is time to take medications.  Will state "I'll take the shot"

## 2016-01-24 NOTE — BHH Group Notes (Signed)
BHH LCSW Group Therapy  01/24/2016 3:22 PM  Type of Therapy:  Group Therapy  Participation Level:  Active  Participation Quality:  Intrusive and Monopolizing  Affect:  Labile  Cognitive:  Disorganized  Insight:  Limited  Engagement in Therapy:  Limited  Modes of Intervention:  Discussion, Education, Socialization and Support  Summary of Progress/Problems: Balance in life: Patients will discuss the concept of balance and how it looks and feels to be unbalanced. Pt will identify areas in their life that is unbalanced and ways to become more balanced.  Jennifer Leon attended group and stayed the entire time. She was intrusive and difficult to redirect. When redirected she would become very upset. No matter what was being discussed, she would only discuss her discharge plan and how she can take care of her self.   [employer] MSW, LCSWA  01/24/2016, 3:22 PM

## 2016-01-24 NOTE — Progress Notes (Signed)
Recreation Therapy Notes  Date: 01.26.17 Time: 3:00 pm Location: Craft Room  Group Topic: Leisure Education  Goal Area(s) Addresses:  Patient will identify activities for each letter of the alphabet. Patient will verbalize ability to integrate positive leisure into life post d/c. Patient will verbalize ability to use leisure as a Associate Professor.  Behavioral Response: Attentive, Interactive  Intervention: Leisure Alphabet  Activity: Patients were given a Leisure Information systems manager and instructed to list healthy leisure activities for each letter of the alphabet bet.  Education: LRT educated patients on what they need to participate in leisure.  Education Outcome: Acknowledges education/In group clarification offered   Clinical Observations/Feedback: Patient completed activity by writing healthy leisure activities. Patient contributed to group discussion by stating some of the leisure activities she wrote down, some things she needs to participate in leisure, and why it is important to use leisure activities as a Associate Professor.  Jacquelynn Cree, LRT/CTRS 01/24/2016 4:27 PM

## 2016-01-24 NOTE — Tx Team (Signed)
Interdisciplinary Treatment Plan Update (Adult)  Date:  01/24/2016 Time Reviewed:  4:16 PM  Progress in Treatment: Attending groups: Yes. Participating in groups:  Yes. Taking medication as prescribed:  Yes. Patient on forced medications Tolerating medication:  Yes. Family/Significant othe contact made:  No, will contact:  if patient provides cosnent Patient understands diagnosis:  No. Discussing patient identified problems/goals with staff:  Yes. Medical problems stabilized or resolved:  Yes. Denies suicidal/homicidal ideation: Yes. Issues/concerns per patient self-inventory:  No. Other:  New problem(s) identified: No, Describe:  none reported  Discharge Plan or Barriers:Patient will stabilize on medications and discharge home. Patient will follow up with her outpatient provider at discharge for medication management and therapy.   Reason for Continuation of Hospitalization: Homicidal ideation Mania  Comments: Patient is[facility] and currently cannot provide any living person to contact outside of the hospital. [place]  called [place] police to do a well check on patient residence and no one was home. CSW also called APS in [facility] and left voicemail and awaiting phone call to be returned to make a report.  Estimated length of stay: up to 7 days expected discharge Thursday 01/31/16  New goal(s):  Review of initial/current patient goals per problem list:   1.  Goal(s):participate in aftercare planning  Met:  No  Target date:by discharge  As evidenced [id number] participation in aftercare plan AEB aftercare provider and housing plan at discharge being identified  2.  Goal (s):mania will become manageable  Met:  No  Target date:by discharge  As evidenced AL:[id number] demonstrates decreased signs and symptoms of mania   Attendees: Physician:  Orson Slick, MD 1/25/201710:20 AM  Nursing:   Elige Radon, RN 1/25/201710:20 AM  Other:  Carmell Austria, LCSWA  1/25/201710:20 AM  Other:  Bonnye Fava, LCSW 1/25/201710:20 AM  Other:  Marylou Flesher, LCSWA 1/25/201710:20 AM  Other: Everitt Amber, LRT 1/25/201710:20 AM  Other: Levada Schilling, PA Student 1/25/201710:20 AM  Other: Christa See, CSW Intern 1/25/201710:20 AM  Other:  1/25/201710:20 AM  Other:  1/25/201710:20 AM  Other:  1/25/201710:20 AM  Other:   1/25/201710:20 AM    Scribe for Treatment Team:   Keene Breath, MSW, [place]  01/24/2016, 4:16 PM

## 2016-01-24 NOTE — Progress Notes (Signed)
D: Pt was extremely intrusive and loud this evening. She was screaming at staff to give her an IM because she refused her PO PRN. Speech was pressured and was centered around Religion. She attempted to barricade herself in her room by pushing a chair up against the door so that staff couldn't complete rounds. After her IM medication the Pt came to the nurses station to request PO PRN for sleep. A: Medications given as prescribed. Encouragement and support provided. Q15 minute checks maintained for safety. R: Pt was intrusive, loud and displayed angry and exaggerated outbursts and was difficult to redirect. PRNs were effective in decreasing outbursts. Pt remains safe on unit. Will continue to monitor.

## 2016-01-24 NOTE — Clinical Social Work Note (Signed)
Clinical Social Work Assessment  Patient Details  Name: Deanie Jupiter MRN: [id number] Date of Birth: 07-Jan-1951  CSW called [place] Reap [phone]) to do a non emergency well check on patient's home ([address] [place], [place] [address]) and to see if her husband may be at home Suann Klier or any other family members and sheriff will call back once this is completed.  Gennette Pac [phone] 01/24/2016, 11:25 AM

## 2016-01-24 NOTE — BHH Group Notes (Signed)
BHH Group Notes:  (Nursing/MHT/Case Management/Adjunct)  Date:  01/24/2016  Time:  8:53 AM  Type of Therapy:  Community Group   Participation Level:  Did Not Attend  Jennifer Leon De'Chelle Fischer Halley 01/24/2016, 8:53 AM 

## 2016-01-24 NOTE — Clinical Social Work Note (Signed)
Clinical Social Work Assessment  Patient Details  Name: Jennifer Leon MRN: [id number] Date of Birth: [birth year]  CSW referred patient to [facility] [phone] and was called in but staff stated a nurse will call to put patient on waitlist. CSW also called [place] Re[place] to have police do a "Well Check Non Emergency Visit" and no one was home when police visited.   Lulu Riding, LCSW 01/24/2016, 4:18 PM

## 2016-01-24 NOTE — Progress Notes (Signed)
[facility] MD Progress Note  01/24/2016 7:04 PM Jennifer Leon  MRN:  [id number]  Subjective:  Jennifer Leon is still very paranoid and unreasonable. She is agitated at times. She refuses oral medications but accepts injections. She is still unable to participate in discharge planning. She told me today that I am isis.  Principal Problem: Schizoaffective disorder, bipolar type (HCC) Diagnosis:   Patient Active Problem List   Diagnosis Date Noted  . Schizoaffective disorder, bipolar type (HCC) [F25.0] 01/14/2016   Total Time spent with patient: 20 minutes  Past Psychiatric History: Bipolar disorder.  Past Medical History:  Past Medical History  Diagnosis Date  . Hypertension   . Anxiety   . GERD (gastroesophageal reflux disease)   . Noncompliance with medication regimen   . PTSD (post-traumatic stress disorder)   . Schizoaffective disorder (HCC)   . Psychosis   . Paranoid delusion [facility])     Past Surgical History  Procedure Laterality Date  . Eye surgery    . Abdominal surgery      blockage  . Bladder surgery     Family History: History reviewed. No pertinent family history. Family Psychiatric  History: None reported. Social History:  History  Alcohol Use No     History  Drug Use No    Social History   Social History  . Marital Status: Married    Spouse Name: N/A  . Number of Children: N/A  . Years of Education: N/A   Social History Main Topics  . Smoking status: Former Games developer  . Smokeless tobacco: None  . Alcohol Use: No  . Drug Use: No  . Sexual Activity: Not Asked   Other Topics Concern  . None   Social History Narrative   Additional Social History:    Pain Medications: See PTA meds Prescriptions: See PTA meds Over the Counter: See PTA meds History of alcohol / drug use?: No history of alcohol / drug abuse                    Sleep: Fair  Appetite:  Fair  Current Medications: Current Facility-Administered Medications  Medication Dose Route  Frequency Provider Last Rate Last Dose  . acetaminophen (TYLENOL) tablet 650 mg  650 mg Oral Q6H PRN Jolanta B Pucilowska, MD      . aspirin chewable tablet 162 mg  162 mg Oral Daily Shari Prows, MD   162 mg at 01/23/16 0821  . LORazepam (ATIVAN) tablet 2 mg  2 mg Oral Q2H PRN Shari Prows, MD       Or  . LORazepam (ATIVAN) injection 2 mg  2 mg Intramuscular Q4H PRN Shari Prows, MD   2 mg at 01/23/16 2352  . magnesium hydroxide (MILK OF MAGNESIA) suspension 30 mL  30 mL Oral Daily PRN Jolanta B Pucilowska, MD      . OLANZapine zydis (ZYPREXA) disintegrating tablet 20 mg  20 mg Oral QAC lunch Shari Prows, MD       Or  . OLANZapine (ZYPREXA) injection 10 mg  10 mg Intramuscular QAC lunch Jolanta B Pucilowska, MD   10 mg at 01/24/16 1148  . traZODone (DESYREL) tablet 150 mg  150 mg Oral QHS PRN Shari Prows, MD   150 mg at 01/24/16 0012    Lab Results:  Results for orders placed or performed during the hospital encounter of 01/14/16 (from the past 48 hour(s))  Lipid panel     Status: Abnormal   Collection  Time: 01/23/16 11:00 AM  Result Value Ref Range   Cholesterol 172 0 - 200 mg/dL   Triglycerides 562 (H) <150 mg/dL   HDL 60 >13 mg/dL   Total CHOL/HDL Ratio 2.9 RATIO   VLDL 47 (H) 0 - 40 mg/dL   LDL Cholesterol 65 0 - 99 mg/dL    Comment:        Total Cholesterol/HDL:CHD Risk Coronary Heart Disease Risk Table                     Men   Women  1/2 Average Risk   3.4   3.3  Average Risk       5.0   4.4  2 X Average Risk   9.6   7.1  3 X Average Risk  23.4   11.0        Use the calculated Patient Ratio above and the CHD Risk Table to determine the patient's CHD Risk.        ATP III CLASSIFICATION (LDL):  <100     mg/dL   Optimal  086-578  mg/dL   Near or Above                    Optimal  130-159  mg/dL   Borderline  469-629  mg/dL   High  >528     mg/dL   Very High   Hemoglobin A1c     Status: None   Collection Time: 01/23/16 11:00  AM  Result Value Ref Range   Hgb A1c MFr Bld 5.3 4.0 - 6.0 %  TSH     Status: None   Collection Time: 01/23/16 11:00 AM  Result Value Ref Range   TSH 2.001 0.350 - 4.500 uIU/mL  Prolactin     Status: None   Collection Time: 01/23/16 11:00 AM  Result Value Ref Range   Prolactin 19.4 4.8 - 23.3 ng/mL    Comment: (NOTE) Performed At: [facility] [address] [place], [place] [id number] Mila Homer MD UV:[id number]     Physical Findings: AIMS: Facial and Oral Movements Muscles of Facial Expression: None, normal Lips and Perioral Area: None, normal Jaw: None, normal Tongue: None, normal,Extremity Movements Upper (arms, wrists, hands, fingers): None, normal Lower (legs, knees, ankles, toes): None, normal, Trunk Movements Neck, shoulders, hips: None, normal, Overall Severity Severity of abnormal movements (highest score from questions above): None, normal Incapacitation due to abnormal movements: None, normal Patient's awareness of abnormal movements (rate only patient's report): No Awareness, Dental Status Current problems with teeth and/or dentures?: No Does patient usually wear dentures?: No  CIWA:    COWS:     Musculoskeletal: Strength & Muscle Tone: within normal limits Gait & Station: normal Patient leans: N/A  Psychiatric Specialty Exam: Review of Systems  All other systems reviewed and are negative.   Blood pressure 118/71, pulse 75, temperature 98.2 F (36.8 C), temperature source Oral, resp. rate 18, height 5' 4.96" (1.65 m), weight 55.339 kg (122 lb), SpO2 100 %.Body mass index is 20.33 kg/(m^2).  General Appearance: Casual  Eye Contact::  Good  Speech:  Pressured  Volume:  Increased  Mood:  Angry, Dysphoric and Irritable  Affect:  Inappropriate and Labile  Thought Process:  Disorganized  Orientation:  Full (Time, Place, and Person)  Thought Content:  Delusions, Hallucinations: Auditory and Paranoid Ideation  Suicidal Thoughts:  No   Homicidal Thoughts:  No  Memory:  Immediate;   Fair Recent;   Fair  Remote;   Fair  Judgement:  Fair  Insight:  Fair  Psychomotor Activity:  Normal  Concentration:  Fair  Recall:  [employer] of Knowledge:Fair  Language: Fair  Akathisia:  No  Handed:  Right  AIMS (if indicated):     Assets:  Communication Skills Desire for Improvement Physical Health  ADL's:  Intact  Cognition: WNL  Sleep:  Number of Hours: 3.45   Treatment Plan Summary: Daily contact with patient to assess and evaluate symptoms and progress in treatment and Medication management   Jennifer Leon is a 65 year old female with a history of psychosis most likely schizoaffective disorder admitted for disorganized, agitated and bizarre behavior.  1. Mood/Psychosis. As the patient has been refusing oral medications we ordered forced medications and will continue Zyprexa oral or injections. Ativan is also available. We started Lithium upon her request but she changed her mind and refused.   2. Coronary artery disease. We continued ASA.  3. Insomnia. She slept 5 hours. We continued trazodone.   4. Metabolic syndrome. Lipid panel, hemoglobin A1c and prolactin are pending.    5. Disposition. TBE. She will likely return to home. She will follow up with [facility] in [place].  Jolanta Pucilowska 01/24/2016, 7:04 PM

## 2016-01-24 NOTE — [facility] Group Notes (Signed)
[facility] Group Notes:  (Nursing/MHT/Case Management/Adjunct)  Date:  01/24/2016  Time:  2:08 PM  Type of Therapy:  Psychoeducational Skills  Participation Level:  Did Not Attend  Jennifer Leon 01/24/2016, 2:08 PM

## 2016-01-25 NOTE — Progress Notes (Signed)
Recreation Therapy Notes  Date: 01.27.17 Time: 1:00 pm Location: Craft Room  Group Topic: Communication, Problem solving, and Teamwork  Goal Area(s) Addresses:  Patient will work in teams towards shared goal. Patient will verbalize skills needed to make activity successful. Patient will verbalize benefit of using skills identified to reach post d/c. goals  Behavioral Response: Did not attend   Intervention: Landing Pad  Activity: Patients were given 15 straws and approximately 3 feet of tape to build a contraption to catch a golf ball that was dropped from about 4 feet.  Education: LRT educated patients on how to use these skills post d/c.  Education Outcome: Patient did not attend group.  Clinical Observations/Feedback: Patient did not attend group.  Tanysha Quant M, LRT/CTRS 01/25/2016 3:09 PM 

## 2016-01-25 NOTE — Progress Notes (Signed)
NUTRITION NOTE:   Jennifer Leon is a 65 y.o. female with Schizoaffective disorder, bipolar type (HCC). Pt triggered for assessment due to LOS  PMH:  Past Medical History  Diagnosis Date  . Hypertension   . Anxiety   . GERD (gastroesophageal reflux disease)   . Noncompliance with medication regimen   . PTSD (post-traumatic stress disorder)   . Schizoaffective disorder (HCC)   . Psychosis   . Paranoid delusion (HCC)     Diet Order: Regular  Current Nutrition: recorded po intake 100% of meals  Anthropometrics:  Body mass index is 20.33 kg/(m^2).   Pt is at no nutrition risk due to diagnosis/current problem, BMI of  20, Regular diet order, 100% po intake. No nutrition intervention warranted at this time. Will sign off. Please re-consult RD if nutritional issues arise.   Romelle Starcher MS, RD, LDN [phone] Pager  [phone] Weekend/On-Call Pager

## 2016-01-25 NOTE — [facility] Group Notes (Signed)
[facility] LCSW Group Therapy  01/25/2016 1:54 PM  Type of Therapy:  Group Therapy  Participation Level:  Did Not Attend  Modes of Intervention:  Discussion, Education, Socialization and Support  Summary of Progress/Problems:Feelings around Relapse. Group members discussed the meaning of relapse and shared personal stories of relapse, how it affected them and others, and how they perceived themselves during this time. Group members were encouraged to identify triggers, warning signs and coping skills used when facing the possibility of relapse. Social supports were discussed and explored in detail.   [employer] MSW, LCSWA  01/25/2016, 1:54 PM

## 2016-01-25 NOTE — Progress Notes (Addendum)
[facility] MD Progress Note  01/25/2016 7:56 PM Jennifer Leon  MRN:  [id number]  Subjective:  Jennifer Leon extremely paranoid and frequently agitated. She only accepts injection of Zyprexa and refuses oral medications. She denies any symptoms of mental or physical illness. She is unable to assist [place] in discharge planning as we have nonidea about her real housing or financial situation. We tried to refer her to [facility] but they require current (within 24 hours) CBC and chemistries which the patient refused today.  Principal Problem: Schizoaffective disorder, bipolar type (HCC) Diagnosis:   Patient Active Problem List   Diagnosis Date Noted  . Schizoaffective disorder, bipolar type (HCC) [F25.0] 01/14/2016   Total Time spent with patient: 20 minutes  Past Psychiatric History: bipolar disorder.  Past Medical History:  Past Medical History  Diagnosis Date  . Hypertension   . Anxiety   . GERD (gastroesophageal reflux disease)   . Noncompliance with medication regimen   . PTSD (post-traumatic stress disorder)   . Schizoaffective disorder (HCC)   . Psychosis   . Paranoid delusion [facility])     Past Surgical History  Procedure Laterality Date  . Eye surgery    . Abdominal surgery      blockage  . Bladder surgery     Family History: History reviewed. No pertinent family history. Family Psychiatric  History: none reported. Social History:  History  Alcohol Use No     History  Drug Use No    Social History   Social History  . Marital Status: Married    Spouse Name: N/A  . Number of Children: N/A  . Years of Education: N/A   Social History Main Topics  . Smoking status: Former Games developer  . Smokeless tobacco: None  . Alcohol Use: No  . Drug Use: No  . Sexual Activity: Not Asked   Other Topics Concern  . None   Social History Narrative   Additional Social History:    Pain Medications: See PTA meds Prescriptions: See PTA meds Over the Counter: See PTA meds History of alcohol /  drug use?: No history of alcohol / drug abuse                    Sleep: Fair  Appetite:  Fair  Current Medications: Current Facility-Administered Medications  Medication Dose Route Frequency Provider Last Rate Last Dose  . acetaminophen (TYLENOL) tablet 650 mg  650 mg Oral Q6H PRN Seniya Stoffers B Janelli Welling, MD      . aspirin chewable tablet 162 mg  162 mg Oral Daily Shari Prows, MD   162 mg at 01/23/16 0821  . LORazepam (ATIVAN) tablet 2 mg  2 mg Oral Q2H PRN Shari Prows, MD       Or  . LORazepam (ATIVAN) injection 2 mg  2 mg Intramuscular Q4H PRN Shari Prows, MD   2 mg at 01/23/16 2352  . magnesium hydroxide (MILK OF MAGNESIA) suspension 30 mL  30 mL Oral Daily PRN Audra Kagel B Christianne Zacher, MD      . OLANZapine zydis (ZYPREXA) disintegrating tablet 20 mg  20 mg Oral QAC lunch Shari Prows, MD       Or  . OLANZapine (ZYPREXA) injection 10 mg  10 mg Intramuscular QAC lunch Shari Prows, MD   10 mg at 01/25/16 1219  . traZODone (DESYREL) tablet 150 mg  150 mg Oral QHS PRN Shari Prows, MD   150 mg at 01/24/16 0012  Lab Results: No results found for this or any previous visit (from the past 48 hour(s)).  Physical Findings: AIMS: Facial and Oral Movements Muscles of Facial Expression: None, normal Lips and Perioral Area: None, normal Jaw: None, normal Tongue: None, normal,Extremity Movements Upper (arms, wrists, hands, fingers): None, normal Lower (legs, knees, ankles, toes): None, normal, Trunk Movements Neck, shoulders, hips: None, normal, Overall Severity Severity of abnormal movements (highest score from questions above): None, normal Incapacitation due to abnormal movements: None, normal Patient's awareness of abnormal movements (rate only patient's report): No Awareness, Dental Status Current problems with teeth and/or dentures?: No Does patient usually wear dentures?: No  CIWA:    COWS:     Musculoskeletal: Strength &  Muscle Tone: within normal limits Gait & Station: normal Patient leans: N/A  Psychiatric Specialty Exam: Review of Systems  All other systems reviewed and are negative.   Blood pressure 122/77, pulse 79, temperature 98.2 F (36.8 C), temperature source Oral, resp. rate 20, height 5' 4.96" (1.65 m), weight 55.339 kg (122 lb), SpO2 100 %.Body mass index is 20.33 kg/(m^2).  General Appearance: Casual  Eye Contact::  Fair  Speech:  Pressured  Volume:  Increased  Mood:  Angry, Dysphoric and Irritable  Affect:  Inappropriate and Labile  Thought Process:  Disorganized  Orientation:  Full (Time, Place, and Person)  Thought Content:  Delusions and Paranoid Ideation  Suicidal Thoughts:  No  Homicidal Thoughts:  No  Memory:  Immediate;   Fair Recent;   Fair Remote;   Fair  Judgement:  Fair  Insight:  Fair  Psychomotor Activity:  Increased  Concentration:  Fair  Recall:  [employer] of Knowledge:Fair  Language: Fair  Akathisia:  No  Handed:  Right  AIMS (if indicated):     Assets:  Physical Health Resilience  ADL's:  Intact  Cognition: WNL  Sleep:  Number of Hours: 7.75   Treatment Plan Summary: Daily contact with patient to assess and evaluate symptoms and progress in treatment and Medication management   Jennifer Leon is a 65 year old female with a history of psychosis most likely schizoaffective disorder admitted for disorganized, agitated and bizarre behavior.  1. Mood/Psychosis. As the patient has been refusing oral medications. We started Zyprexa injections per forced medication order. She now accepts injections. We started Lithium upon her request but she changed her mind and refused.   2. Coronary artery disease. We continued ASA.  3. Insomnia. She slept 7 hours. We continued trazodone.   4. Metabolic syndrome. Lipid panel, hemoglobin A1C, TSH and prolactin are normal.     5. Disposition. TBE. She will likely return to home. She will follow up with [facility] in  [place].  I certify that the services received since the previous certification/recertification were and continue to be medically necessary as the treatment provided can be reasonably expected to improve the patient's condition; the medical record documents that the services furnished were intensive treatment services or their equivalent services, and this patient continues to need, on a daily basis, active treatment furnished directly by or requiring the supervision of inpatient psychiatric personnel.    Lorene Klimas 01/25/2016, 7:56 PM

## 2016-01-25 NOTE — Progress Notes (Signed)
D: Pt denies SI/HI/AVH. Pt is pleasant and cooperative, affect is bright, interacting with staff and peers appropriately, no bizarre episode.  A: Pt was offered support and encouragement. Pt was given scheduled medications. Pt was encouraged to attend groups. Q 15 minute checks were done for safety.  R:Pt attends groups and interacts well with peers and staff. Pt is taking medication. Pt has no complaints.Pt receptive to treatment and safety maintained on unit.

## 2016-01-25 NOTE — [facility] Group Notes (Signed)
[facility] LCSW Aftercare Discharge Planning Group Note   01/25/2016 10:48 AM  Participation Quality:  Active   Mood/Affect:  Labile  Depression Rating:  0  Anxiety Rating:  0  Thoughts of Suicide:  No Will you contract for safety?   NA  Current AVH:  No  Plan for Discharge/Comments:  Pt states "I have created a blue print that covers all those concerns." She states she is going to receive counseling through her church and seeing a psychiatrist is against her religion. "When God says no, I don't go around saying yes." She states she has a lease that has not expired and will be returning there. She is unable to provide working phone numbers to any support she has named.   Transportation Means: Pt names [name] who is a family friend. If Mrs. Jimmye Norman cannot transport her she states " I can do it myself, but I need access to the funds."  Supports: family, church   Abrial Arrighi L [place] MSW, [employer]

## 2016-01-25 NOTE — Progress Notes (Signed)
Patient quiet and reserved until you tell her it's time for her medications.  Then patient becomes loud.  Will only take the injection.  States "I will take the  shot."  When asked where she would like her shot at, patient states "I'm not going to tell you, you choose.  You know this is illegal.  You are the one that is going to burn for this."  Allows for injection to be given without any difficulty.  Safety maintained.  Patient refused to have her labs drawn.  Became agitated and started saying "it's a conspiracy"

## 2016-01-25 NOTE — Progress Notes (Signed)
Patient ID: Jennifer Leon, female   DOB: 04-Dec-1951, 65 y.o.   MRN: [id number]  CSW completed APS report with [facility].   Daisy Floro Graceanne Guin MSW, LCSWA  01/25/2016 3:00 PM

## 2016-01-25 NOTE — Plan of Care (Signed)
Problem: Alteration in thought process Goal: LTG-Patient behavior demonstrates decreased signs psychosis (Patient behavior demonstrates decreased signs of psychosis to the point the patient is safe to return home and continue treatment in an outpatient setting.)  Outcome: Progressing Decreased psychosis noted.      

## 2016-01-26 NOTE — Plan of Care (Signed)
Problem: Ineffective individual coping Goal: LTG: Patient will report a decrease in negative feelings Outcome: Progressing Patient denies SI/HI.      

## 2016-01-26 NOTE — Plan of Care (Signed)
Problem: Ineffective individual coping Goal: STG: Patient will remain free from self harm Outcome: Progressing Patient remains free from self harm this shift     

## 2016-01-26 NOTE — Plan of Care (Signed)
Problem: Ineffective individual coping Goal: LTG: Patient will report a decrease in negative feelings Outcome: Not Progressing Patient does not report feeling better today than she did yesterday     

## 2016-01-26 NOTE — [facility] Group Notes (Signed)
[facility] LCSW Group Therapy  01/26/2016 2:16 PM  Type of Therapy:  Group Therapy  Participation Level:  Did Not Attend  Modes of Intervention:  Discussion, Education, Socialization and Support  Summary of Progress/Problems: Todays topic: Grudges  Patients will be encouraged to discuss their thoughts, feelings, and behaviors as to why one holds on to grudges and reasons why people have grudges. Patients will process the impact of grudges on their daily lives and identify thoughts and feelings related to holding grudges. Patients will identify feelings and thoughts related to what life would look like without grudges.   Alcus Bradly L Hill Mackie MSW, LCSWA  01/26/2016, 2:16 PM   

## 2016-01-26 NOTE — Progress Notes (Signed)
D: Pt denies SI/HI/AVH. Pt is irritable but complaint with care. Patient appears less anxious and noted to have less interaction with peers and staff.  A: Pt was offered support and encouragement. Pt was given scheduled medications. Pt was encouraged to attend groups. Q 15 minute checks were done for safety.  R:Pt attends group and interacts well with peers and staff. Pt is taking medication. Safety maintained on unit.

## 2016-01-26 NOTE — [facility] Group Notes (Signed)
[facility] Group Notes:  (Nursing/MHT/Case Management/Adjunct)  Date:  01/26/2016  Time:  12:13 PM  Type of Therapy:  Group Therapy  Participation Level:  Did Not Attend   Swayze Kozuch De'Chelle Lucianna Ostlund 01/26/2016, 12:13 PM

## 2016-01-26 NOTE — Progress Notes (Signed)
D:  Patient is alert on the unit this shift.  Patient attended some but not all groups today.  Patient eats meals in the day room and sits in the day room occasionally watching TV or sitting quietly with her eyes closed.  Patient is not witnessed interacting with peers.  Patient sits in her room looking out the window today.     A:  Scheduled medications are administered to patient as per MD orders.  Emotional support and encouragement are provided.  Patient is maintained on q.15 minute safety checks.  Patient is informed to notify staff with questions or concerns. R:  No adverse medication reactions are noted.  Patient refuses po medication administration today but is somewhat cooperative with IM medication administration.  Patient continues to insist that caregivers are denying patient her constitutional rights by keeping her here and medicating her.  Patient remains safe at this time.

## 2016-01-26 NOTE — Progress Notes (Signed)
[facility] MD Progress Note  01/26/2016 6:22 PM Jennifer Leon  MRN:  [id number]  Subjective:  Patient was very difficult to interact with today. She shouted at me to get out of her room as soon as I had introduced myself. It is easy to observe that she remains agitated with an angry affect no insight and much agitation although she seems a little better than she was a couple days ago. Principal Problem: Schizoaffective disorder, bipolar type (HCC) Diagnosis:   Patient Active Problem List   Diagnosis Date Noted  . Schizoaffective disorder, bipolar type (HCC) [F25.0] 01/14/2016   Total Time spent with patient: 20 minutes  Past Psychiatric History: bipolar disorder.  Past Medical History:  Past Medical History  Diagnosis Date  . Hypertension   . Anxiety   . GERD (gastroesophageal reflux disease)   . Noncompliance with medication regimen   . PTSD (post-traumatic stress disorder)   . Schizoaffective disorder (HCC)   . Psychosis   . Paranoid delusion [facility])     Past Surgical History  Procedure Laterality Date  . Eye surgery    . Abdominal surgery      blockage  . Bladder surgery     Family History: History reviewed. No pertinent family history. Family Psychiatric  History: none reported. Social History:  History  Alcohol Use No     History  Drug Use No    Social History   Social History  . Marital Status: Married    Spouse Name: N/A  . Number of Children: N/A  . Years of Education: N/A   Social History Main Topics  . Smoking status: Former Games developer  . Smokeless tobacco: None  . Alcohol Use: No  . Drug Use: No  . Sexual Activity: Not Asked   Other Topics Concern  . None   Social History Narrative   Additional Social History:    Pain Medications: See PTA meds Prescriptions: See PTA meds Over the Counter: See PTA meds History of alcohol / drug use?: No history of alcohol / drug abuse                    Sleep: Fair  Appetite:  Fair  Current  Medications: Current Facility-Administered Medications  Medication Dose Route Frequency Provider Last Rate Last Dose  . acetaminophen (TYLENOL) tablet 650 mg  650 mg Oral Q6H PRN Jolanta B Pucilowska, MD      . aspirin chewable tablet 162 mg  162 mg Oral Daily Shari Prows, MD   162 mg at 01/23/16 0821  . LORazepam (ATIVAN) tablet 2 mg  2 mg Oral Q2H PRN Shari Prows, MD       Or  . LORazepam (ATIVAN) injection 2 mg  2 mg Intramuscular Q4H PRN Shari Prows, MD   2 mg at 01/23/16 2352  . magnesium hydroxide (MILK OF MAGNESIA) suspension 30 mL  30 mL Oral Daily PRN Jolanta B Pucilowska, MD      . OLANZapine zydis (ZYPREXA) disintegrating tablet 20 mg  20 mg Oral QAC lunch Shari Prows, MD       Or  . OLANZapine (ZYPREXA) injection 10 mg  10 mg Intramuscular QAC lunch Jolanta B Pucilowska, MD   10 mg at 01/26/16 1154  . traZODone (DESYREL) tablet 150 mg  150 mg Oral QHS PRN Shari Prows, MD   150 mg at 01/24/16 0012    Lab Results: No results found for this or any previous visit (from the past  48 hour(s)).  Physical Findings: AIMS: Facial and Oral Movements Muscles of Facial Expression: None, normal Lips and Perioral Area: None, normal Jaw: None, normal Tongue: None, normal,Extremity Movements Upper (arms, wrists, hands, fingers): None, normal Lower (legs, knees, ankles, toes): None, normal, Trunk Movements Neck, shoulders, hips: None, normal, Overall Severity Severity of abnormal movements (highest score from questions above): None, normal Incapacitation due to abnormal movements: None, normal Patient's awareness of abnormal movements (rate only patient's report): No Awareness, Dental Status Current problems with teeth and/or dentures?: No Does patient usually wear dentures?: No  CIWA:    COWS:     Musculoskeletal: Strength & Muscle Tone: within normal limits Gait & Station: normal Patient leans: N/A  Psychiatric Specialty Exam: Review of  Systems  Unable to perform ROS: psychiatric disorder  All other systems reviewed and are negative.   Blood pressure 122/77, pulse 79, temperature 98.2 F (36.8 C), temperature source Oral, resp. rate 20, height 5' 4.96" (1.65 m), weight 55.339 kg (122 lb), SpO2 100 %.Body mass index is 20.33 kg/(m^2).  General Appearance: Casual  Eye Contact::  Fair  Speech:  Pressured  Volume:  Increased  Mood:  Angry, Dysphoric and Irritable  Affect:  Inappropriate and Labile  Thought Process:  Disorganized  Orientation:  Full (Time, Place, and Person)  Thought Content:  Delusions and Paranoid Ideation  Suicidal Thoughts:  No  Homicidal Thoughts:  No  Memory:  Immediate;   Fair Recent;   Fair Remote;   Fair  Judgement:  Fair  Insight:  Fair  Psychomotor Activity:  Increased  Concentration:  Fair  Recall:  [employer] of Knowledge:Fair  Language: Fair  Akathisia:  No  Handed:  Right  AIMS (if indicated):     Assets:  Physical Health Resilience  ADL's:  Intact  Cognition: WNL  Sleep:  Number of Hours: 5.75   Treatment Plan Summary: Daily contact with patient to assess and evaluate symptoms and progress in treatment and Medication management   Jennifer Leon is a 65 year old female with a history of psychosis most likely schizoaffective disorder admitted for disorganized, agitated and bizarre behavior.  1. Mood/Psychosis. As the patient has been refusing oral medications. We started Zyprexa injections per forced medication order. She now accepts injections. We started Lithium upon her request but she changed her mind and refused.   2. Coronary artery disease. We continued ASA.  3. Insomnia. She slept 7 hours. We continued trazodone.   4. Metabolic syndrome. Lipid panel, hemoglobin A1C, TSH and prolactin are normal.     5. Disposition. TBE. She will likely return to home. She will follow up with [facility] in [place].   John Clapacs 01/26/2016, 6:22 PM

## 2016-01-27 NOTE — Progress Notes (Signed)
[facility] MD Progress Note  01/27/2016 3:34 PM Jennifer Leon  MRN:  [id number]  Subjective:  Patient was very difficult to interact with today. She shouted at me to get out of her room as soon as I had introduced myself. It is easy to observe that she remains agitated with an angry affect no insight and much agitation although she seems a little better than she was a couple days ago. Principal Problem: Schizoaffective disorder, bipolar type (HCC) Diagnosis:   Patient Active Problem List   Diagnosis Date Noted  . Schizoaffective disorder, bipolar type (HCC) [F25.0] 01/14/2016   Total Time spent with patient: 20 minutes  Past Psychiatric History: bipolar disorder.  Past Medical History:  Past Medical History  Diagnosis Date  . Hypertension   . Anxiety   . GERD (gastroesophageal reflux disease)   . Noncompliance with medication regimen   . PTSD (post-traumatic stress disorder)   . Schizoaffective disorder (HCC)   . Psychosis   . Paranoid delusion [facility])     Past Surgical History  Procedure Laterality Date  . Eye surgery    . Abdominal surgery      blockage  . Bladder surgery     Family History: History reviewed. No pertinent family history. Family Psychiatric  History: none reported. Social History:  History  Alcohol Use No     History  Drug Use No    Social History   Social History  . Marital Status: Married    Spouse Name: N/A  . Number of Children: N/A  . Years of Education: N/A   Social History Main Topics  . Smoking status: Former Games developer  . Smokeless tobacco: None  . Alcohol Use: No  . Drug Use: No  . Sexual Activity: Not Asked   Other Topics Concern  . None   Social History Narrative   Additional Social History:    Pain Medications: See PTA meds Prescriptions: See PTA meds Over the Counter: See PTA meds History of alcohol / drug use?: No history of alcohol / drug abuse                    Sleep: Fair  Appetite:  Fair  Current  Medications: Current Facility-Administered Medications  Medication Dose Route Frequency Provider Last Rate Last Dose  . acetaminophen (TYLENOL) tablet 650 mg  650 mg Oral Q6H PRN Jolanta B Pucilowska, MD      . aspirin chewable tablet 162 mg  162 mg Oral Daily Shari Prows, MD   162 mg at 01/23/16 0821  . LORazepam (ATIVAN) tablet 2 mg  2 mg Oral Q2H PRN Shari Prows, MD       Or  . LORazepam (ATIVAN) injection 2 mg  2 mg Intramuscular Q4H PRN Shari Prows, MD   2 mg at 01/23/16 2352  . magnesium hydroxide (MILK OF MAGNESIA) suspension 30 mL  30 mL Oral Daily PRN Jolanta B Pucilowska, MD      . OLANZapine zydis (ZYPREXA) disintegrating tablet 20 mg  20 mg Oral QAC lunch Shari Prows, MD       Or  . OLANZapine (ZYPREXA) injection 10 mg  10 mg Intramuscular QAC lunch Jolanta B Pucilowska, MD   10 mg at 01/27/16 1152  . traZODone (DESYREL) tablet 150 mg  150 mg Oral QHS PRN Shari Prows, MD   150 mg at 01/24/16 0012    Lab Results: No results found for this or any previous visit (from the past  48 hour(s)).  Physical Findings: AIMS: Facial and Oral Movements Muscles of Facial Expression: None, normal Lips and Perioral Area: None, normal Jaw: None, normal Tongue: None, normal,Extremity Movements Upper (arms, wrists, hands, fingers): None, normal Lower (legs, knees, ankles, toes): None, normal, Trunk Movements Neck, shoulders, hips: None, normal, Overall Severity Severity of abnormal movements (highest score from questions above): None, normal Incapacitation due to abnormal movements: None, normal Patient's awareness of abnormal movements (rate only patient's report): No Awareness, Dental Status Current problems with teeth and/or dentures?: No Does patient usually wear dentures?: No  CIWA:    COWS:     Musculoskeletal: Strength & Muscle Tone: within normal limits Gait & Station: normal Patient leans: N/A  Psychiatric Specialty Exam: Review of  Systems  Unable to perform ROS: psychiatric disorder  All other systems reviewed and are negative.   Blood pressure 122/77, pulse 79, temperature 98.2 F (36.8 C), temperature source Oral, resp. rate 20, height 5' 4.96" (1.65 m), weight 55.339 kg (122 lb), SpO2 100 %.Body mass index is 20.33 kg/(m^2).  General Appearance: Casual  Eye Contact::  Fair  Speech:  Pressured  Volume:  Increased  Mood:  Angry, Dysphoric and Irritable  Affect:  Inappropriate and Labile  Thought Process:  Disorganized  Orientation:  Full (Time, Place, and Person)  Thought Content:  Delusions and Paranoid Ideation  Suicidal Thoughts:  No  Homicidal Thoughts:  No  Memory:  Immediate;   Fair Recent;   Fair Remote;   Fair  Judgement:  Fair  Insight:  Fair  Psychomotor Activity:  Increased  Concentration:  Fair  Recall:  [employer] of Knowledge:Fair  Language: Fair  Akathisia:  No  Handed:  Right  AIMS (if indicated):     Assets:  Physical Health Resilience  ADL's:  Intact  Cognition: WNL  Sleep:  Number of Hours: 2.75   Treatment Plan Summary: Daily contact with patient to assess and evaluate symptoms and progress in treatment and Medication management   Jennifer Leon is a 65 year old female with a history of psychosis most likely schizoaffective disorder admitted for disorganized, agitated and bizarre behavior.  1. Mood/Psychosis. As the patient has been refusing oral medications. We started Zyprexa injections per forced medication order. She now accepts injections. We started Lithium upon her request but she changed her mind and refused.   2. Coronary artery disease. We continued ASA.  3. Insomnia. She slept 7 hours. We continued trazodone.   4. Metabolic syndrome. Lipid panel, hemoglobin A1C, TSH and prolactin are normal.     5. Disposition. TBE. She will likely return to home. She will follow up with [facility] in [place]. Social worker today asked me to see if we can get labs again.  [facility] sure that her blood sugars are under control. Hemoglobin A1c had been normal. We will see if we can get a repeat basic chemistry to see if her blood sugar and potassium are okay tomorrow morning. Patient will continue on current medicine. She remains agitated but is not nearly as bad as she was before. Not screaming at me today.   John Clapacs 01/27/2016, 3:34 PM

## 2016-01-27 NOTE — Progress Notes (Signed)
D: Patient remains disorganized. Patient attended groups and has been visible in the milieu. She denies SI/HI/AVH. She also denies pain. She's been irritable and suspicious saying things like, "I know what's going on here." A: Patient was compliant with IM medication. Encouragement was provided.  R: Patient remained calm and cooperative. Safety maintained with 15 min checks.

## 2016-01-27 NOTE — Progress Notes (Signed)
Patient alert during shift. Provided with toiletries. Patient checks clock throughout the night but is pleasant. No unsafe behaviors noted. No concerns voiced. Will continue to monitor.

## 2016-01-27 NOTE — [facility] Group Notes (Signed)
[facility] LCSW Group Therapy  01/27/2016 4:03 PM  Type of Therapy:  Group Therapy  Participation Level:  Active  Participation Quality:  Attentive  Affect:  Irritable  Cognitive:  Alert  Insight:  Limited  Engagement in Therapy:  Limited  Modes of Intervention:  Discussion, Education, Socialization and Support  Summary of Progress/Problems: .Mindfulness: Patient discussed mindfulness and relaxing techniques and why they are beneficial. Pt discussed ways to incorporate mindfulness in their lives. Pt practiced a mindfulness techique and discussed how it made them feel. Reham was great in group today. She was not argumentative or intrusive. She participated in the activity and worked well with the other group members.   [employer] MSW, LCSWA  01/27/2016, 4:03 PM

## 2016-01-27 NOTE — Plan of Care (Signed)
Problem: Ineffective individual coping Goal: STG: Pt will be able to identify effective and ineffective STG: Pt will be able to identify effective and ineffective coping patterns  Outcome: Progressing Patient was able to remains calm and cooperative on shift with no behavioral issues.

## 2016-01-27 NOTE — Progress Notes (Signed)
Patient was quite on shift, behavior remains bizarre although and she seems to be responding to internal stimuli, She was back and forth to her room. She appears to be resting in bed quietly at this time.

## 2016-01-28 LAB — BASIC METABOLIC PANEL
ANION GAP: 8 (ref 5–15)
BUN: 17 mg/dL (ref 6–20)
CALCIUM: 9.1 mg/dL (ref 8.9–10.3)
CO2: 25 mmol/L (ref 22–32)
Chloride: 108 mmol/L (ref 101–111)
Creatinine, Ser: 0.8 mg/dL (ref 0.44–1.00)
GLUCOSE: 150 mg/dL — AB (ref 65–99)
Potassium: 4.1 mmol/L (ref 3.5–5.1)
SODIUM: 141 mmol/L (ref 135–145)

## 2016-01-28 NOTE — Progress Notes (Signed)
D: Patient denies SI/HI/AVH.   Patient affect is irritable and labile.  Patient is loud, intrusive, and disruptive to the milieu.   A: Support and encouragement offered. Patient refused scheduled PO medications stating, "It is against my religion."  Q 15 min checks continued for patient safety. R: Patient in not receptive receptive. Patient remains safe on the unit.

## 2016-01-28 NOTE — Progress Notes (Signed)
Patient refuses sleep medications. Patient asks about items that she thinks were left with the sheriff when she arrived. She states that this is a real world affair. The patient states that Trump is trying to get with the Russians and her doctor should be watched. Patient slept 4.5 hrs. Patient refused lab work and vitals this morning.

## 2016-01-28 NOTE — Clinical Social Work Note (Signed)
Clinical Social Work Assessment  Patient Details  Name: Michaline Kindig MRN: [id number] Date of Birth: 1951/05/10  CSW met with patient but patient was not able to provide a safe discharge plan as she is still disorganized, paranoid and demanding. Patient believes that staff does not want her to leave and is sabotaging her discharge. With patient permission, CSW looked through patient belongings with patient to see if there were any phone numbers to call to assist with discharge as patient is not able to provide a working number. Patient believes she can return to her apartment but this has not been confirmed and patient may have been evicted from residence. CSW had [facility] visit her address [place], [place] but no one was home. CSW will continue to seek safe housing arrangement and discharge plan. Patient is on wait list to [place]  Iris Pert  01/28/2016, 3:12 PM

## 2016-01-28 NOTE — Progress Notes (Addendum)
[facility] MD Progress Note  01/28/2016 12:26 PM Jennifer Leon  MRN:  [id number]  Subjective:  Jennifer Leon is rather agitated and difficult to redirect today. She insists on being discharged. She still refuses oral medication receives daily injection of Zyprexa. She is still unable to work with the Education officer, museum on safe discharge plan. She presented with a letter stating that she has been in a psychiatric facility in [place] for 11 months and then was cleared by the judge gave her a letter that she has no mental illness. In talking to her it becomes more clear that the patient has been hospitalized multiple times as well as arrested multiple times. She is paranoid and delusional. Last time she called me isis. Today she believes that some Muslims are after her since she is a [place]. She has little insight into her problems and insists that she is afflicted with Addison's disease and not mental illness.  Principal Problem: Schizoaffective disorder, bipolar type ([place]) Diagnosis:   Patient Active Problem List   Diagnosis Date Noted  . Schizoaffective disorder, bipolar type ([place]) [F25.0] 01/14/2016   Total Time spent with patient: 20 minutes  Past Psychiatric History: Bipolar disorder.  Past Medical History:  Past Medical History  Diagnosis Date  . Hypertension   . Anxiety   . GERD (gastroesophageal reflux disease)   . Noncompliance with medication regimen   . PTSD (post-traumatic stress disorder)   . Schizoaffective disorder ([place])   . Psychosis   . Paranoid delusion [facility])     Past Surgical History  Procedure Laterality Date  . Eye surgery    . Abdominal surgery      blockage  . Bladder surgery     Family History: History reviewed. No pertinent family history. Family Psychiatric  History: None reported. Social History:  History  Alcohol Use No     History  Drug Use No    Social History   Social History  . Marital Status: Married    Spouse Name: N/A  . Number of Children: N/A   . Years of Education: N/A   Social History Main Topics  . Smoking status: Former Research scientist (life sciences)  . Smokeless tobacco: None  . Alcohol Use: No  . Drug Use: No  . Sexual Activity: Not Asked   Other Topics Concern  . None   Social History Narrative   Additional Social History:    Pain Medications: See PTA meds Prescriptions: See PTA meds Over the Counter: See PTA meds History of alcohol / drug use?: No history of alcohol / drug abuse                    Sleep: Fair  Appetite:  Fair  Current Medications: Current Facility-Administered Medications  Medication Dose Route Frequency Provider Last Rate Last Dose  . acetaminophen (TYLENOL) tablet 650 mg  650 mg Oral Q6H PRN Nery Kalisz B Alantis Bethune, MD      . aspirin chewable tablet 162 mg  162 mg Oral Daily Clovis Fredrickson, MD   162 mg at 01/23/16 0821  . LORazepam (ATIVAN) tablet 2 mg  2 mg Oral Q2H PRN Clovis Fredrickson, MD       Or  . LORazepam (ATIVAN) injection 2 mg  2 mg Intramuscular Q4H PRN Clovis Fredrickson, MD   2 mg at 01/23/16 2352  . magnesium hydroxide (MILK OF MAGNESIA) suspension 30 mL  30 mL Oral Daily PRN Clovis Fredrickson, MD      . OLANZapine  zydis (ZYPREXA) disintegrating tablet 20 mg  20 mg Oral QAC lunch Clovis Fredrickson, MD       Or  . OLANZapine (ZYPREXA) injection 10 mg  10 mg Intramuscular QAC lunch Tonilynn Bieker B Cadon Raczka, MD   10 mg at 01/28/16 1141  . traZODone (DESYREL) tablet 150 mg  150 mg Oral QHS PRN Clovis Fredrickson, MD   150 mg at 01/24/16 0012    Lab Results:  Results for orders placed or performed during the hospital encounter of 01/14/16 (from the past 48 hour(s))  Basic metabolic panel     Status: Abnormal   Collection Time: 01/28/16 11:07 AM  Result Value Ref Range   Sodium 141 135 - 145 mmol/L   Potassium 4.1 3.5 - 5.1 mmol/L   Chloride 108 101 - 111 mmol/L   CO2 25 22 - 32 mmol/L   Glucose, Bld 150 (H) 65 - 99 mg/dL   BUN 17 6 - 20 mg/dL   Creatinine, Ser 0.80 0.44  - 1.00 mg/dL   Calcium 9.1 8.9 - 10.3 mg/dL   GFR calc non Af Amer >60 >60 mL/min   GFR calc Af Amer >60 >60 mL/min    Comment: (NOTE) The eGFR has been calculated using the CKD EPI equation. This calculation has not been validated in all clinical situations. eGFR's persistently <60 mL/min signify possible Chronic Kidney Disease.    Anion gap 8 5 - 15    Physical Findings: AIMS: Facial and Oral Movements Muscles of Facial Expression: None, normal Lips and Perioral Area: None, normal Jaw: None, normal Tongue: None, normal,Extremity Movements Upper (arms, wrists, hands, fingers): None, normal Lower (legs, knees, ankles, toes): None, normal, Trunk Movements Neck, shoulders, hips: None, normal, Overall Severity Severity of abnormal movements (highest score from questions above): None, normal Incapacitation due to abnormal movements: None, normal Patient's awareness of abnormal movements (rate only patient's report): No Awareness, Dental Status Current problems with teeth and/or dentures?: No Does patient usually wear dentures?: No  CIWA:    COWS:     Musculoskeletal: Strength & Muscle Tone: within normal limits Gait & Station: normal Patient leans: N/A  Psychiatric Specialty Exam: Review of Systems  All other systems reviewed and are negative.   Blood pressure 122/77, pulse 79, temperature 98.2 F (36.8 C), temperature source Oral, resp. rate 20, height 5' 4.96" (1.65 m), weight 55.339 kg (122 lb), SpO2 100 %.Body mass index is 20.33 kg/(m^2).  General Appearance: Casual  Eye Contact::  Good  Speech:  Pressured  Volume:  Increased  Mood:  Angry, Dysphoric and Irritable  Affect:  Inappropriate and Labile  Thought Process:  Disorganized  Orientation:  Full (Time, Place, and Person)  Thought Content:  Delusions and Paranoid Ideation  Suicidal Thoughts:  No  Homicidal Thoughts:  No  Memory:  Immediate;   Fair Recent;   Fair Remote;   Fair  Judgement:  Poor  Insight:   Lacking  Psychomotor Activity:  Increased  Concentration:  Fair  Recall:  [employer] of Knowledge:Fair  Language: Fair  Akathisia:  No  Handed:  Right  AIMS (if indicated):     Assets:  Communication Skills  ADL's:  Intact  Cognition: WNL  Sleep:  Number of Hours: 4.5   Treatment Plan Summary: Daily contact with patient to assess and evaluate symptoms and progress in treatment and Medication management   Jennifer Leon is a 65 year old female with a history of psychosis most likely schizoaffective disorder admitted for disorganized, agitated and  bizarre behavior.  1. Mood/Psychosis. As the patient has been refusing oral medications. We started Zyprexa injections per forced medication order. She now accepts injections. We started Lithium upon her request but she changed her mind and refused.   2. Coronary artery disease. We continued ASA.  3. Insomnia. She slept 4.5 hours but is using sleeping aid.   4. Metabolic syndrome. Lipid panel, hemoglobin A1C, TSH and prolactin are normal.    5. Disposition. TBE. We [facility] On Tuesday she agreed to do labs necessary to complete referral. SW to follow up. She reportedly has a place to go but we were unable to confirm it. She will follow up with [facility] in [place].  Jennifer Leon 01/28/2016, 12:26 PM

## 2016-01-28 NOTE — Progress Notes (Signed)
Recreation Therapy Notes  Date: 01.30.17 Time: 3:00 pm Location: Craft Room  Group Topic: Self-expression  Goal Area(s) Addresses:  Patient will identify one color per emotion listed on wheel. Patient will verbalize benefit of using art as a means of self-expression. Patient will verbalize one emotion experienced during group. Patient will be educated on other forms of self-expression.  Behavioral Response: Inattentive  Intervention: Emotion Wheel  Activity: Patients were given an Arboriculturist with 7 different emotions and were instructed to pick a color for each emotion.  Education: LRT educated patients on other forms of self-expression.  Education Outcome: In group clarification offered  Clinical Observations/Feedback: Patient left group at approximately 3:10 pm. Patient returned to group at approximately 3:15 pm. Patient colored a coloring sheet during group. Patient did not contribute to group discussion.  Jacquelynn Cree, LRT/CTRS 01/28/2016 4:01 PM

## 2016-01-29 NOTE — [facility] Group Notes (Signed)
[facility] Group Notes:  (Nursing/MHT/Case Management/Adjunct)  Date:  01/29/2016  Time:  1:26 AM  Type of Therapy:  Group Therapy  Participation Level:  Active  Participation Quality:  Appropriate  Affect:  Appropriate  Cognitive:  Appropriate  Insight:  Improving  Engagement in Group:  Developing/Improving  Modes of Intervention:  n/a  Summary of Progress/Problems:  Veva Holes 01/29/2016, 1:26 AM

## 2016-01-29 NOTE — Progress Notes (Signed)
[facility] MD Progress Note  01/29/2016 3:01 PM Remedios Mckone  MRN:  [id number]  Subjective:  Jennifer Leon did not improve. She isn't paranoid and delusional, very guarded, loud and easily agitated, refusing oral medications, demanding discharged immediately. In spite of our efforts with has not been able to establish safety discharge plan for this patient. We have treatment team today. It was decided to start the PASSR and the guardianship process that she has not been able to care for herself.  Principal Problem: Schizoaffective disorder, bipolar type ([place]) Diagnosis:   Patient Active Problem List   Diagnosis Date Noted  . Schizoaffective disorder, bipolar type ([place]) [F25.0] 01/14/2016   Total Time spent with patient: 20 minutes  Past Psychiatric History: Bipolar disorder.  Past Medical History:  Past Medical History  Diagnosis Date  . Hypertension   . Anxiety   . GERD (gastroesophageal reflux disease)   . Noncompliance with medication regimen   . PTSD (post-traumatic stress disorder)   . Schizoaffective disorder ([place])   . Psychosis   . Paranoid delusion [facility])     Past Surgical History  Procedure Laterality Date  . Eye surgery    . Abdominal surgery      blockage  . Bladder surgery     Family History: History reviewed. No pertinent family history. Family Psychiatric  History: None reported. Social History:  History  Alcohol Use No     History  Drug Use No    Social History   Social History  . Marital Status: Married    Spouse Name: N/A  . Number of Children: N/A  . Years of Education: N/A   Social History Main Topics  . Smoking status: Former Research scientist (life sciences)  . Smokeless tobacco: None  . Alcohol Use: No  . Drug Use: No  . Sexual Activity: Not Asked   Other Topics Concern  . None   Social History Narrative   Additional Social History:    Pain Medications: See PTA meds Prescriptions: See PTA meds Over the Counter: See PTA meds History of alcohol / drug use?: No  history of alcohol / drug abuse                    Sleep: Fair  Appetite:  Fair  Current Medications: Current Facility-Administered Medications  Medication Dose Route Frequency Provider Last Rate Last Dose  . acetaminophen (TYLENOL) tablet 650 mg  650 mg Oral Q6H PRN Jolanta B Pucilowska, MD      . aspirin chewable tablet 162 mg  162 mg Oral Daily Clovis Fredrickson, MD   162 mg at 01/23/16 0821  . LORazepam (ATIVAN) tablet 2 mg  2 mg Oral Q2H PRN Clovis Fredrickson, MD       Or  . LORazepam (ATIVAN) injection 2 mg  2 mg Intramuscular Q4H PRN Clovis Fredrickson, MD   2 mg at 01/23/16 2352  . magnesium hydroxide (MILK OF MAGNESIA) suspension 30 mL  30 mL Oral Daily PRN Jolanta B Pucilowska, MD      . OLANZapine zydis (ZYPREXA) disintegrating tablet 20 mg  20 mg Oral QAC lunch Clovis Fredrickson, MD       Or  . OLANZapine (ZYPREXA) injection 10 mg  10 mg Intramuscular QAC lunch Jolanta B Pucilowska, MD   10 mg at 01/29/16 1254  . traZODone (DESYREL) tablet 150 mg  150 mg Oral QHS PRN Clovis Fredrickson, MD   150 mg at 01/24/16 0012    Lab Results:  Results for orders placed or performed during the hospital encounter of 01/14/16 (from the past 48 hour(s))  Basic metabolic panel     Status: Abnormal   Collection Time: 01/28/16 11:07 AM  Result Value Ref Range   Sodium 141 135 - 145 mmol/L   Potassium 4.1 3.5 - 5.1 mmol/L   Chloride 108 101 - 111 mmol/L   CO2 25 22 - 32 mmol/L   Glucose, Bld 150 (H) 65 - 99 mg/dL   BUN 17 6 - 20 mg/dL   Creatinine, Ser 0.80 0.44 - 1.00 mg/dL   Calcium 9.1 8.9 - 10.3 mg/dL   GFR calc non Af Amer >60 >60 mL/min   GFR calc Af Amer >60 >60 mL/min    Comment: (NOTE) The eGFR has been calculated using the CKD EPI equation. This calculation has not been validated in all clinical situations. eGFR's persistently <60 mL/min signify possible Chronic Kidney Disease.    Anion gap 8 5 - 15    Physical Findings: AIMS: Facial and Oral  Movements Muscles of Facial Expression: None, normal Lips and Perioral Area: None, normal Jaw: None, normal Tongue: None, normal,Extremity Movements Upper (arms, wrists, hands, fingers): None, normal Lower (legs, knees, ankles, toes): None, normal, Trunk Movements Neck, shoulders, hips: None, normal, Overall Severity Severity of abnormal movements (highest score from questions above): None, normal Incapacitation due to abnormal movements: None, normal Patient's awareness of abnormal movements (rate only patient's report): No Awareness, Dental Status Current problems with teeth and/or dentures?: No Does patient usually wear dentures?: No  CIWA:    COWS:     Musculoskeletal: Strength & Muscle Tone: within normal limits Gait & Station: normal Patient leans: N/A  Psychiatric Specialty Exam: Review of Systems  All other systems reviewed and are negative.   Blood pressure 122/77, pulse 79, temperature 98.2 F (36.8 C), temperature source Oral, resp. rate 20, height 5' 4.96" (1.65 m), weight 55.339 kg (122 lb), SpO2 100 %.Body mass index is 20.33 kg/(m^2).  General Appearance: Fairly Groomed  Engineer, water::  Fair  Speech:  Pressured  Volume:  Increased  Mood:  Angry, Dysphoric and Irritable  Affect:  Inappropriate and Labile  Thought Process:  Disorganized  Orientation:  Full (Time, Place, and Person)  Thought Content:  Delusions and Paranoid Ideation  Suicidal Thoughts:  No  Homicidal Thoughts:  No  Memory:  Immediate;   Fair Recent;   Fair Remote;   Fair  Judgement:  Poor  Insight:  Lacking  Psychomotor Activity:  Increased  Concentration:  Fair  Recall:  [employer] of Knowledge:Fair  Language: Fair  Akathisia:  No  Handed:  Right  AIMS (if indicated):     Assets:  Communication Skills Desire for Improvement Financial Resources/Insurance Housing Physical Health Resilience Social Support  ADL's:  Intact  Cognition: WNL  Sleep:  Number of Hours: 4.5   Treatment  Plan Summary: Daily contact with patient to assess and evaluate symptoms and progress in treatment and Medication management    Jennifer Leon is a 65 year old female with a history of psychosis most likely schizoaffective disorder admitted for disorganized, agitated and bizarre behavior.  1. Mood/Psychosis. As the patient has been refusing oral medications. We started Zyprexa injections per forced medication order. She now accepts injections. We started Lithium upon her request but she changed her mind and refused.   2. Coronary artery disease. We continued ASA.  3. Insomnia. She slept 4.5 hours but is using sleeping aid.   4. Metabolic syndrome.  Lipid panel, hemoglobin A1C, TSH and prolactin are normal.    5. Disposition. TBE.The [facility] She will follow up with [facility] in [place].   Jolanta Pucilowska 01/29/2016, 3:01 PM

## 2016-01-29 NOTE — Progress Notes (Signed)
D: Pt is seen in milieu this evening interacting. Pt affect is irritable and labile. Pt states "I'm supposed to go home. I don't need to be here but they can't find my landlord because he's running from the law." Denies SI/HI/AVH at this time. Pt came into medication room this evening stating, "someone has my husband and I need to find out who it is. I am going to talk to my social worker about it tomorrow." A: Emotional support and encouragement provided. q15 minute safety checks maintained. R: Pt remains free from harm.

## 2016-01-29 NOTE — Progress Notes (Signed)
Patient with depressed affect, angry. Refuses am meds and refuses po meds. Minimal interaction with peers. Withdrawn in room. Angry and irritable with staff, intrusive. No active participant in plan of care. No SI/HI at this time. Safety maintained.

## 2016-01-29 NOTE — Plan of Care (Signed)
Problem: Alteration in thought process Goal: LTG-Patient behavior demonstrates decreased signs psychosis (Patient behavior demonstrates decreased signs of psychosis to the point the patient is safe to return home and continue treatment in an outpatient setting.)  Outcome: Progressing Pt does not appear to be responding to internal stimuli. Denies AVH at this time     

## 2016-01-29 NOTE — Progress Notes (Signed)
Recreation Therapy Notes  Date: 01.31.17 Time: 3:00 pm Location: Craft Room  Group Topic: Goal Setting  Goal Area(s) Addresses:  Patient will write at least one goal. Patient will write at least one obstacle.  Behavioral Response: Disruptive, Left early  Intervention: Recovery Goal Chart  Activity: Patients were instructed to make a Recovery Goal Chart including goals, obstacles, the date they started working on their goals, and the date they achieved their goals.  Education: LRT educated patients on ways they can celebrate reaching their goals.  Education Outcome: Patient left before LRT educated group.  Clinical Observations/Feedback: Patient left group at approximately 3:07 pm stating it was the same group every day. Patient returned to group at approximately 3:10 pm stating she wanted to help others learn from her. LRT had to redirect patient as patient continued to talk off topic. Patient left group again at 3:12 pm. Patient returned to group at approximately 3:25 pm and colored. Patient left group at approximately 3:28 pm after a patient stated it was good to stay on her medications. Patient stormed out talked about how she did not want to take medications. Patient did not return to group. When LRT ended group and was leaving group room, patient stated she would not come back to group because the only thing LRT talks about is medication. Patient continued talked about being institutionalized due to medication.   Jacquelynn Cree, LRT/CTRS 01/29/2016 4:32 PM

## 2016-01-29 NOTE — Tx Team (Signed)
Interdisciplinary Treatment Plan Update (Adult)  Date:  01/29/2016 Time Reviewed:  4:50 PM  Progress in Treatment: Attending groups: Yes. Participating in groups:  Yes. Taking medication as prescribed:  Yes. Patient on forced medications Tolerating medication:  Yes. Family/Significant othe contact made:  No, will contact:  if patient provides cosnent Patient understands diagnosis:  No. Discussing patient identified problems/goals with staff:  Yes. Medical problems stabilized or resolved:  Yes. Denies suicidal/homicidal ideation: Yes. Issues/concerns per patient self-inventory:  No. Other:  New problem(s) identified: No, Describe:  none reported  Discharge Plan or Barriers:Patient will stabilize on medications and discharge home. Patient will follow up with her outpatient provider at discharge for medication management and therapy.   Reason for Continuation of Hospitalization: Homicidal ideation Mania  Comments: [facility] and currently cannot provide any living person to contact outside of the hospital. [place]  called [place] police to do a well check on patient residence and no one was home. CSW also called APS in [facility] and left voicemail and awaiting phone call to be returned to make a report.  Estimated length of stay: up to 7 days expected discharge Tuesday 02/05/16  New goal(s):  Review of initial/current patient goals per problem list:   1.  Goal(s):participate in aftercare planning  Met:  No  Target date:by discharge  As evidenced OL:IDCVUDT participation in aftercare plan AEB aftercare provider and housing plan at discharge being identified  1/43/88: police went by patient home but no one answered. No confirmation on patient residence and if she can return. Patient not able to provide working number to relative or friend. Patient is currently on [facility] wait list as of Monday 01/28/16  2.  Goal (s):mania will become manageable  Met:  No  Target  date:by discharge  As evidenced IL:[id number] demonstrates decreased signs and symptoms of mania 01/29/16: patient still manic, disorganized and labile, irritable with staff this morning wanting to discharge and thinks staff is ISIS.  Attendees: Physician:  Orson Slick, MD 1/25/201710:20 AM  Nursing:   Carolynn Sayers, RN 1/25/201710:20 AM  Other:  Carmell Austria, LCSWA 1/25/201710:20 AM  Other:   Everitt Amber, LRT 1/25/201710:20 AM  Other:   1/25/201710:20 AM  Other: 1/25/201710:20 AM  Other:  1/25/201710:20 AM  Other:  1/25/201710:20 AM  Other:  1/25/201710:20 AM  Other:  1/25/201710:20 AM  Other:  1/25/201710:20 AM  Other:   1/25/201710:20 AM    Scribe for Treatment Team:   Keene Breath, MSW, LCSWA [phone]  01/29/2016, 4:50 PM

## 2016-01-29 NOTE — Plan of Care (Signed)
Problem: Consults Goal: [facility] General Treatment Patient Education Outcome: Not Progressing Patient remains angry, irritable and not active participent of plan of care.

## 2016-01-30 MED ORDER — ZIPRASIDONE MESYLATE 20 MG IM SOLR: 20.0000 mg | INTRAMUSCULAR | Status: DC | PRN

## 2016-01-30 NOTE — Plan of Care (Signed)
Problem: Alteration in thought process Goal: LTG-Patient verbalizes understanding importance med regimen (Patient verbalizes understanding of importance of medication regimen and need to continue outpatient care.)  Outcome: Not Progressing Pt continues to believe that she does not need to be medicated stating "medication is the problem."

## 2016-01-30 NOTE — Progress Notes (Signed)
Recreation Therapy Notes  Date: 02.01.17 Time: 3:00 pm  Location: Craft Room  Group Topic: Self-esteem  Goal Area(s) Addresses:  Patients will write at least one positive trait about themselves. Patients will verbalize benefit of having a healthy self-esteem.  Behavioral Response: Attentive, Left early  Intervention: I Am  Activity: Patients were given a worksheet with the letter I on it and instructed to write as many positive traits inside the letter.  Education: LRT educated patients on ways they can increase their self-esteem.  Education Outcome: In group clarification offered  Clinical Observations/Feedback: Patient colored her coloring sheet. Patient left group at approximately 3:30 pm after see asked a question about washing clothes and LRT told patient she would answer her question after group. Patient did not return to group.  Jacquelynn Cree, LRT/CTRS 01/30/2016 4:59 PM

## 2016-01-30 NOTE — [facility] Group Notes (Signed)
[facility] LCSW Group Therapy  01/30/2016 4:03 PM  Type of Therapy:  Group Therapy  Participation Level:  Minimal  Participation Quality:  Intrusive and angry, explosive  Affect:  Labile and hyperverbal and hyperreligious  Cognitive:  Alert, Disorganized and paranoid  Insight:  Distracting, Limited and Off Topic  Engagement in Therapy:  Distracting, Limited and Off Topic  Modes of Intervention:  Limit-setting and Socialization  Summary of Progress/Problems: Patient attended group and participated in introductions introducing herself and sharing her "superpower" is "my faith in the [place]...." . Patient became focused on religiosity and was not able to participate in group. Patient began yelling and believes that staff is trying to keep her in the hospital. Patient is paranoid and manic and had to leave group after 20 minutes.   Lulu Riding, MSW, LCSWA 01/30/2016, 4:03 PM

## 2016-01-30 NOTE — Progress Notes (Signed)
D: Observed pt in dayroom, not interacting much with others. Patient alert and oriented x4. Patient denies SI/HI/AVH. Pt affect is labile. Pt is irritable, but much less so than when admitted. Pt discussed at length her belief that "psychotropic medications are the problem" and that "I got to get out of here." Pt preoccupied with her Addison's disease. Pt has no insight into her reason for hospitalization and is very paranoid and suspicious of staff and medications. Pt became irritable whenever Clinical research associate didn't agree with pt's statements.  A: Offered active listening and support. Provided therapeutic communication. Encouraged pt to attempt to participate in care more. R: Pt redirectable and cooperative. No pt medications this pm. Will continue Q15 min. checks. Safety maintained.

## 2016-01-30 NOTE — [facility] Group Notes (Signed)
[facility] Group Notes:  (Nursing/MHT/Case Management/Adjunct)  Date:  01/30/2016  Time:  1:59 PM  Type of Therapy:  Psychoeducational Skills  Participation Level:  Minimal  Participation Quality:  Monopolizing  Affect:  Blunted and Irritable  Cognitive:  Disorganized  Insight:  Limited  Engagement in Group:  Monopolizing and Off Topic  Modes of Intervention:  Discussion and Education  Summary of Progress/Problems:  Marquette Old 01/30/2016, 1:59 PM

## 2016-01-30 NOTE — Progress Notes (Addendum)
[facility] MD Progress Note  01/30/2016 12:37 PM Kaedence Connelly  MRN:  [id number]  Subjective:  Ms. Gruetzmacher is rather agitated today. She is paranoid and delusional. She walks with the Bible and threatens knee with the [place]. She adamantly refuses to take any medications including injectable today telling me again that her problem is medical in nature, namely Addison's disease. For the past several days she was accepting injections of Zyprexa no problems. She refused today. We will be forced to return to force medications as the patient is making very slow progress. She was in the hold today in order to administer medicines. [facility] We discussed the case yesterday and it appears that she may need to go to manage her affairs as she is unable to do so. We were unable to come up with a safe discharge plan. Today the patient has been that she could go live with her sister-in-law. [place]. We would not be able to talk to her as of yet.  Principal Problem: Schizoaffective disorder, bipolar type ([facility]) Diagnosis:   Patient Active Problem List   Diagnosis Date Noted  . Schizoaffective disorder, bipolar type ([facility]) [F25.0] 01/14/2016   Total Time spent with patient: 20 minutes  Past Psychiatric History: Bipolar disorder.  Past Medical History:  Past Medical History  Diagnosis Date  . Hypertension   . Anxiety   . GERD (gastroesophageal reflux disease)   . Noncompliance with medication regimen   . PTSD (post-traumatic stress disorder)   . Schizoaffective disorder ([facility])   . Psychosis   . Paranoid delusion [facility]     Past Surgical History  Procedure Laterality Date  . Eye surgery    . Abdominal surgery      blockage  . Bladder surgery     Family History: History reviewed. No pertinent family history. Family Psychiatric  History: None reported. Social History:  History  Alcohol Use No     History  Drug Use No    Social History    Social History  . Marital Status: Married    Spouse Name: N/A  . Number of Children: N/A  . Years of Education: N/A   Social History Main Topics  . Smoking status: Former Games developer  . Smokeless tobacco: None  . Alcohol Use: No  . Drug Use: No  . Sexual Activity: Not Asked   Other Topics Concern  . None   Social History Narrative   Additional Social History:    Pain Medications: See PTA meds Prescriptions: See PTA meds Over the Counter: See PTA meds History of alcohol / drug use?: No history of alcohol / drug abuse                    Sleep: Fair  Appetite:  Fair  Current Medications: Current Facility-Administered Medications  Medication Dose Route Frequency Provider Last Rate Last Dose  . acetaminophen (TYLENOL) tablet 650 mg  650 mg Oral Q6H PRN Briel Gallicchio B Tammi Boulier, MD      . aspirin chewable tablet 162 mg  162 mg Oral Daily Shari Prows, MD   162 mg at 01/23/16 0821  . LORazepam (ATIVAN) tablet 2 mg  2 mg Oral Q2H PRN Shari Prows, MD       Or  . LORazepam (ATIVAN) injection 2 mg  2 mg Intramuscular Q4H PRN Shari Prows, MD   2 mg at 01/23/16 2352  . magnesium  hydroxide (MILK OF MAGNESIA) suspension 30 mL  30 mL Oral Daily PRN Kannan Proia B Aaliyah Cancro, MD      . OLANZapine zydis (ZYPREXA) disintegrating tablet 20 mg  20 mg Oral QAC lunch Shari Prows, MD       Or  . OLANZapine (ZYPREXA) injection 10 mg  10 mg Intramuscular QAC lunch Haivyn Oravec B Elmo Rio, MD   10 mg at 01/30/16 1222  . traZODone (DESYREL) tablet 150 mg  150 mg Oral QHS PRN Shari Prows, MD   150 mg at 01/24/16 0012    Lab Results: No results found for this or any previous visit (from the past 48 hour(s)).  Physical Findings: AIMS: Facial and Oral Movements Muscles of Facial Expression: None, normal Lips and Perioral Area: None, normal Jaw: None, normal Tongue: None, normal,Extremity Movements Upper (arms, wrists, hands, fingers): None, normal Lower  (legs, knees, ankles, toes): None, normal, Trunk Movements Neck, shoulders, hips: None, normal, Overall Severity Severity of abnormal movements (highest score from questions above): None, normal Incapacitation due to abnormal movements: None, normal Patient's awareness of abnormal movements (rate only patient's report): No Awareness, Dental Status Current problems with teeth and/or dentures?: No Does patient usually wear dentures?: No  CIWA:    COWS:     Musculoskeletal: Strength & Muscle Tone: within normal limits Gait & Station: normal Patient leans: N/A  Psychiatric Specialty Exam: Review of Systems  All other systems reviewed and are negative.   Blood pressure 150/82, pulse 68, temperature 98.2 F (36.8 C), temperature source Oral, resp. rate 20, height 5' 4.96" (1.65 m), weight 55.339 kg (122 lb), SpO2 100 %.Body mass index is 20.33 kg/(m^2).  General Appearance: Casual  Eye Contact::  Good  Speech:  Pressured  Volume:  Increased  Mood:  Angry, Dysphoric and Irritable  Affect:  Inappropriate and Labile  Thought Process:  Disorganized  Orientation:  Full (Time, Place, and Person)  Thought Content:  Delusions and Paranoid Ideation  Suicidal Thoughts:  No  Homicidal Thoughts:  No  Memory:  Immediate;   Fair Recent;   Fair Remote;   Fair  Judgement:  Poor  Insight:  Lacking  Psychomotor Activity:  Increased  Concentration:  Fair  Recall:  [employer] of Knowledge:Fair  Language: Fair  Akathisia:  No  Handed:  Right  AIMS (if indicated):     Assets:  Communication Skills Physical Health Resilience  ADL's:  Intact  Cognition: WNL  Sleep:  Number of Hours: 5   Treatment Plan Summary: Daily contact with patient to assess and evaluate symptoms and progress in treatment and Medication management   Ms. Curet is a 65 year old female with a history of psychosis most likely schizoaffective disorder admitted for disorganized, agitated and bizarre behavior.  1.  Mood/Psychosis. As the patient has been refusing oral medications. We started Zyprexa injections per forced medication order. We renewed forced medication order.    2. Coronary artery disease. We continued ASA.  3. Insomnia. She slept 5 hours but is using sleeping aid.   4. Metabolic syndrome. Lipid panel, hemoglobin A1C, TSH and prolactin are normal.    5. Disposition. TBE.[facility] She will follow up with [facility] in [place].   Kristine Linea, MD 01/30/2016, 12:37 PM

## 2016-01-30 NOTE — [facility] Group Notes (Signed)
[facility] LCSW Aftercare Discharge Planning Group Note   01/30/2016 11:32 AM  Participation Quality: Active  Mood/Affect:  Appropriate  Depression Rating: Patient rated her depression as a 0 out of 10.  Anxiety Rating: Patient rated her anxiety as a 0 out of 10.  Thoughts of Suicide:  No Will you contract for safety?   NA  Current AVH:  No  Plan for Discharge/Comments: Patient attended and participated in group discussion appropriately. Patient introduced herself and identified her goals are to be more realistic about expectations, set boundaries, be responsible for her actions, and to obtain reading glasses to utilize her coping skills (reading, art, and music). Patient shared that she will continue working closely with her assigned CSW on her discharge plan.  Transportation Means: Patient was informed about different transportation resources.  Supports: Patient shared that her spouse, Annette Stable, and the church are forms of support for her.  Jenel Lucks, CSW Intern 01/30/16  Beryl Meager, MSW, Theresia Majors 01/30/2016

## 2016-01-31 MED ORDER — LITHIUM CARBONATE 300 MG PO CAPS
300.0000 mg | ORAL_CAPSULE | Freq: Three times a day (TID) | ORAL | Status: DC
  Filled 2016-01-31 (×4): qty 1

## 2016-01-31 NOTE — Plan of Care (Signed)
Problem: Consults Goal: [facility] General Treatment Patient Education Outcome: Not Progressing Patient remains sullen, withdrawn and refusing to participate in recommended plan of care.

## 2016-01-31 NOTE — Progress Notes (Signed)
Patient with angry affect, irritable and refuses am aspirin. Eats meals in dayroom,returns to room and  refuses therapy groups. No SI/HI voiced. Minimal interaction with peers. Safety maintained.

## 2016-01-31 NOTE — Plan of Care (Signed)
Problem: Alteration in thought process Goal: LTG-Patient behavior demonstrates decreased signs psychosis (Patient behavior demonstrates decreased signs of psychosis to the point the patient is safe to return home and continue treatment in an outpatient setting.)  Outcome: Progressing Decreased psychosis noted.      

## 2016-01-31 NOTE — [facility] Group Notes (Signed)
[facility] Group Notes:  (Nursing/MHT/Case Management/Adjunct)  Date:  01/31/2016  Time:  12:58 AM  Type of Therapy:  Group Therapy  Participation Level:  Did Not Attend    Summary of Progress/Problems:  Jennifer Leon 01/31/2016, 12:58 AM

## 2016-01-31 NOTE — [facility] Group Notes (Signed)
[facility] LCSW Group Therapy  01/31/2016 3:50 PM  Type of Therapy:  Group Therapy  Participation Level:  Did Not Attend   Modes of Intervention:  Discussion, Education, Socialization and Support  Summary of Progress/Problems: Balance in life: Patients will discuss the concept of balance and how it looks and feels to be unbalanced. Pt will identify areas in their life that is unbalanced and ways to become more balanced.    Urho Rio L Kabir Brannock MSW, LCSWA  01/31/2016, 3:50 PM   

## 2016-01-31 NOTE — Progress Notes (Signed)
[facility] MD Progress Note  01/31/2016 3:14 PM Jennifer Leon  MRN:  [id number]  Subjective: Jennifer Leon is very agitated today. She did accept Zyprexa injection. She demanded to meet with her treatment team. She was loud, paranoid, argumentative and agitated during the meeting. We made no progress.  Principal Problem: Schizoaffective disorder, bipolar type (HCC) Diagnosis:   Patient Active Problem List   Diagnosis Date Noted  . Schizoaffective disorder, bipolar type (HCC) [F25.0] 01/14/2016   Total Time spent with patient: 30 minutes  Past Psychiatric History: Bipolar disorder.  Past Medical History:  Past Medical History  Diagnosis Date  . Hypertension   . Anxiety   . GERD (gastroesophageal reflux disease)   . Noncompliance with medication regimen   . PTSD (post-traumatic stress disorder)   . Schizoaffective disorder (HCC)   . Psychosis   . Paranoid delusion [facility])     Past Surgical History  Procedure Laterality Date  . Eye surgery    . Abdominal surgery      blockage  . Bladder surgery     Family History: History reviewed. No pertinent family history. Family Psychiatric  History: None reported. Social History:  History  Alcohol Use No     History  Drug Use No    Social History   Social History  . Marital Status: Married    Spouse Name: N/A  . Number of Children: N/A  . Years of Education: N/A   Social History Main Topics  . Smoking status: Former Games developer  . Smokeless tobacco: None  . Alcohol Use: No  . Drug Use: No  . Sexual Activity: Not Asked   Other Topics Concern  . None   Social History Narrative   Additional Social History:    Pain Medications: See PTA meds Prescriptions: See PTA meds Over the Counter: See PTA meds History of alcohol / drug use?: No history of alcohol / drug abuse                    Sleep: Fair  Appetite:  Good  Current Medications: Current Facility-Administered Medications  Medication Dose Route Frequency Provider  Last Rate Last Dose  . acetaminophen (TYLENOL) tablet 650 mg  650 mg Oral Q6H PRN Jolanta B Pucilowska, MD      . aspirin chewable tablet 162 mg  162 mg Oral Daily Shari Prows, MD   162 mg at 01/23/16 0821  . magnesium hydroxide (MILK OF MAGNESIA) suspension 30 mL  30 mL Oral Daily PRN Jolanta B Pucilowska, MD      . OLANZapine zydis (ZYPREXA) disintegrating tablet 20 mg  20 mg Oral QAC lunch Shari Prows, MD       Or  . OLANZapine (ZYPREXA) injection 10 mg  10 mg Intramuscular QAC lunch Jolanta B Pucilowska, MD   10 mg at 01/31/16 1322  . traZODone (DESYREL) tablet 150 mg  150 mg Oral QHS PRN Shari Prows, MD   150 mg at 01/24/16 0012  . ziprasidone (GEODON) injection 20 mg  20 mg Intramuscular Q2H PRN Jolanta B Pucilowska, MD        Lab Results: No results found for this or any previous visit (from the past 48 hour(s)).  Physical Findings: AIMS: Facial and Oral Movements Muscles of Facial Expression: None, normal Lips and Perioral Area: None, normal Jaw: None, normal Tongue: None, normal,Extremity Movements Upper (arms, wrists, hands, fingers): None, normal Lower (legs, knees, ankles, toes): None, normal, Trunk Movements Neck, shoulders, hips: None, normal,  Overall Severity Severity of abnormal movements (highest score from questions above): None, normal Incapacitation due to abnormal movements: None, normal Patient's awareness of abnormal movements (rate only patient's report): No Awareness, Dental Status Current problems with teeth and/or dentures?: No Does patient usually wear dentures?: No  CIWA:    COWS:     Musculoskeletal: Strength & Muscle Tone: within normal limits Gait & Station: normal Patient leans: N/A  Psychiatric Specialty Exam: Review of Systems  All other systems reviewed and are negative.   Blood pressure 150/82, pulse 68, temperature 98.2 F (36.8 C), temperature source Oral, resp. rate 20, height 5' 4.96" (1.65 m), weight 55.339  kg (122 lb), SpO2 100 %.Body mass index is 20.33 kg/(m^2).  General Appearance: Casual  Eye Contact::  Good  Speech:  Pressured  Volume:  Increased  Mood:  Angry, Dysphoric and Irritable  Affect:  Inappropriate and Labile  Thought Process:  Disorganized  Orientation:  Full (Time, Place, and Person)  Thought Content:  Delusions and Paranoid Ideation  Suicidal Thoughts:  No  Homicidal Thoughts:  No  Memory:  Immediate;   Fair Recent;   Fair Remote;   Fair  Judgement:  Poor  Insight:  Fair and Lacking  Psychomotor Activity:  Increased  Concentration:  Fair  Recall:  [employer] of Knowledge:Fair  Language: Fair  Akathisia:  No  Handed:  Right  AIMS (if indicated):     Assets:  Architect Physical Health Resilience  ADL's:  Intact  Cognition: WNL  Sleep:  Number of Hours: 4.75   Treatment Plan Summary: Daily contact with patient to assess and evaluate symptoms and progress in treatment and Medication management   Jennifer Leon is a 65 year old female with a history of psychosis most likely schizoaffective disorder admitted for disorganized, agitated and bizarre behavior.  1. Mood/Psychosis. As the patient has been refusing oral medications. We started Zyprexa injections per forced medication order. We renewed forced medication order. She initially agreed to take medications by mouth but changed her mind by the end of out meeting. Will offer lithium again.  2. Coronary artery disease. We continued ASA.  3. Insomnia. She slept 5 hours but is using sleeping aid.   4. Metabolic syndrome. Lipid panel, hemoglobin A1C, TSH and prolactin are normal.    5. We ordered double portions.   6. Disposition. TBE.[facility] She will follow up with [facility] in [place].  Kristine Linea, MD 01/31/2016, 3:14 PM

## 2016-01-31 NOTE — Tx Team (Signed)
Interdisciplinary Treatment Plan Update (Adult)  Date:  01/31/2016 Time Reviewed:  3:42 PM  Progress in Treatment: Attending groups: Yes. Participating in groups:  No. and As evidenced by:  patient is not able to particpate as she is disruptive in group Taking medication as prescribed:  Yes. Patient on forced medications Tolerating medication:  Yes. Family/Significant othe contact made:  No, will contact:  if patient provides cosnent Patient understands diagnosis:  No. Discussing patient identified problems/goals with staff:  Yes. Medical problems stabilized or resolved:  Yes. Denies suicidal/homicidal ideation: Yes. Issues/concerns per patient self-inventory:  No. Other:  New problem(s) identified: No, Describe:  none reported  Discharge Plan or Barriers:Patient will stabilize on medications and discharge home. Patient will follow up with her outpatient provider at discharge for medication management and therapy.   Reason for Continuation of Hospitalization: Homicidal ideation Mania  Comments: Patient on [facility] wait list. Not safe for discharge and is manic and agitated easily.  Patient is very organized.  Estimated length of stay: up to 7 days expected discharge Thursday 02/07/16  New goal(s):  Review of initial/current patient goals per problem list:   1.  Goal(s):participate in aftercare planning  Met:  No  Target date:by discharge  As evidenced AT:FTDDUKG participation in aftercare plan AEB aftercare provider and housing plan at discharge being identified  2/54/27: police went by patient home but no one answered. No confirmation on patient residence and if she can return. Patient not able to provide working number to relative or friend. Patient is currently on [facility] wait list as of Monday 01/28/16  01/31/16: patient still disorganized and not able to participate in discharge planning at this time.  2.  Goal (s):mania will become manageable  Met:  No  Target date:by  discharge  As evidenced CW:CBJSEGB demonstrates decreased signs and symptoms of mania 01/29/16: patient still manic, disorganized and labile, irritable with staff this morning wanting to discharge and thinks staff is ISIS. 01/31/16: CSW met with patient and MD to discuss discharge plan and patient became aggressive and agitated and not able to converse with staff as she was yelling and screaming.   Attendees: Physician:  Orson Slick, MD 1/25/201710:20 AM  Nursing:   Elige Radon, RN 1/25/201710:20 AM  Other:  Carmell Austria, LCSWA 1/25/201710:20 AM  Other:    1/25/201710:20 AM  Other:   1/25/201710:20 AM  Other: 1/25/201710:20 AM  Other:  1/25/201710:20 AM  Other:  1/25/201710:20 AM  Other:  1/25/201710:20 AM  Other:  1/25/201710:20 AM  Other:  1/25/201710:20 AM  Other:   1/25/201710:20 AM    Scribe for Treatment Team:   Keene Breath, MSW, LCSWA [phone]  01/31/2016, 3:42 PM

## 2016-01-31 NOTE — Progress Notes (Signed)
.  D: Pt denies SI/HI/AVH. Pt is pleasant and cooperative with care. No bizarre behavior noted. Patient appears less anxious and she is interacting with peers and staff appropriately.  A: Pt was offered support and encouragement. Pt was given scheduled medications. Pt was encouraged to attend groups. Q 15 minute checks were done for safety.  R:Pt attends groups and interacts well with peers and staff. Pt is taking medication. Pt has no complaints.Pt receptive to treatment and safety maintained on unit.

## 2016-01-31 NOTE — Progress Notes (Signed)
Recreation Therapy Notes  Date: 02.02.17 Time: 3:00 pm Location: Craft Room  Group Topic: Leisure Education  Goal Area(s) Addresses:  Patient will identify things they are grateful for. Patient will identify how being grateful can influence decision making.  Behavioral Response: Did not attend  Intervention: Grateful Wheel  Activity: Patients were given an "I Am Grateful For" worksheet and instructed to list 2-3 things they are grateful for under each category.  Education: LRT educated patients on ways they can put leisure back into their schedule.  Education Outcome: Patient did not attend group.  Clinical Observations/Feedback: Patient did not attend group.  Jacquelynn Cree, LRT/CTRS 01/31/2016 3:46 PM

## 2016-01-31 NOTE — Progress Notes (Signed)
Patient refusing to take her medications.  States "I'm not going to take it you are going to have to fight"  Patient then starts to run up and down the halls continually yelling, then runs into dayroom.  Tries to hide herself behind the chairs.  Then gets on floor and crawls underneath the tables.    Manual hold has to be done to give injection.  Dr. Jennet Maduro made aware of events.

## 2016-02-01 NOTE — Progress Notes (Signed)
Pt is very irritable, denies SI/HI/AVH, refuses all medication, verbally abusive towards staff, paranoid delusions-think that staff are trying to kill her because of her Addison's disease, pt is very preoccupied with Addison's disease, irritable/angry affect and mood during interaction, resistant to care.

## 2016-02-01 NOTE — Progress Notes (Signed)
Recreation Therapy Notes  Date: 02.03.17 Time: 3:00 pm Location: Craft Room  Group Topic: Communication, Problem Solving, Teamwork  Goal Area(s) Addresses:  Patient will work in teams towards shared goal. Patient will verbalize skills needed to make activities successful. Patient will verbalize benefit of using skills identify to reach post d/c goals.  Behavioral Response: Attentive, Interactive  Intervention: Landing Pad  Activity: Patients were given 15 straws and about 2.5-3 feet of tape and instructed to build a contraption to catch a golf ball dropped from about 4 feet.  Education: LRT educated patients on why these skills are important.  Education Outcome: In group clarification offered  Clinical Observations/Feedback: Patient worked with peers to build contraption. Patient used effective teamwork, communication, and problem solving skills. Patient contributed to group discussion by stating what would change if she started using the skills in group.  Jacquelynn Cree, LRT/CTRS 02/01/2016 4:49 PM

## 2016-02-01 NOTE — Plan of Care (Signed)
Problem: Alteration in thought process Goal: STG-Patient is able to discuss thoughts with staff Outcome: Progressing Pt discussed thoughts and beliefs with staff. Thoughts were irrational, persecutory, hyper religious, and paranoid.

## 2016-02-01 NOTE — [facility] Group Notes (Signed)
[facility] LCSW Group Therapy  02/01/2016 5:08 PM  Type of Therapy:  Group Therapy  Participation Level:  Active  Participation Quality:  Intrusive and Monopolizing  Affect:  Angry  Cognitive:  Disorganized  Insight:  Limited  Engagement in Therapy:  Limited  Modes of Intervention:  Discussion, Education, Socialization and Support  Summary of Progress/Problems: Feelings around Relapse. Group members discussed the meaning of relapse and shared personal stories of relapse, how it affected them and others, and how they perceived themselves during this time. Group members were encouraged to identify triggers, warning signs and coping skills used when facing the possibility of relapse. Social supports were discussed and explored in detail. Jennifer Leon attended group but was asked to leave. She became argumentative towards another patient. She was disrespectful and hostile towards a patient who was sharing. She was standing above the other patient screaming at her. CSW asked her to leave multiple times but finally left after attempting to contact security. CSW requested physician to place a order that the patient is not allowed to attend groups due to compromising the therapeutic environment for other patients.   [employer] MSW, LCSWA  02/01/2016, 5:08 PM

## 2016-02-01 NOTE — Progress Notes (Signed)
[facility] MD Progress Note  02/01/2016 3:43 PM Jennifer Leon  MRN:  [id number]  Subjective:  Jennifer Leon is a 65 year old female with a history of very bad bipolar disorder. She was brought to the hospital floridly psychotic. She refuses all oral medications and has been maintained on daily injections of Zyprexa. There is minimal improvement. The patient is still agitated, disruptive, paranoid, and difficult to redirect. We do not have any collateral information even though the patient indicates that she was hospitalized extensively in [place]. We have no contact with the family. We have her address but no way to get in touch with her landlord.  Jennifer Leon made no progress. She still refuses medications. She called Social Security and today to complain about being in the hospital unnecessary came with impingement of her freedom. She is loud and unruly. She was disruptive in group and picked up a fight with her peer. Social workers requested that the patient will not be allowed to come to groups  Principal Problem: Schizoaffective disorder, bipolar type (HCC) Diagnosis:   Patient Active Problem List   Diagnosis Date Noted  . Schizoaffective disorder, bipolar type (HCC) [F25.0] 01/14/2016   Total Time spent with patient: 20 minutes  Past Psychiatric History: Bipolar disorder.  Past Medical History:  Past Medical History  Diagnosis Date  . Hypertension   . Anxiety   . GERD (gastroesophageal reflux disease)   . Noncompliance with medication regimen   . PTSD (post-traumatic stress disorder)   . Schizoaffective disorder (HCC)   . Psychosis   . Paranoid delusion [name])     Past Surgical History  Procedure Laterality Date  . Eye surgery    . Abdominal surgery      blockage  . Bladder surgery     Family History: History reviewed. No pertinent family history. Family Psychiatric  History: None reported. Social History:  History  Alcohol Use No     History  Drug Use No    Social History    Social History  . Marital Status: Married    Spouse Name: N/A  . Number of Children: N/A  . Years of Education: N/A   Social History Main Topics  . Smoking status: Former Games developer  . Smokeless tobacco: None  . Alcohol Use: No  . Drug Use: No  . Sexual Activity: Not Asked   Other Topics Concern  . None   Social History Narrative   Additional Social History:    Pain Medications: See PTA meds Prescriptions: See PTA meds Over the Counter: See PTA meds History of alcohol / drug use?: No history of alcohol / drug abuse                    Sleep: Fair  Appetite:  Fair  Current Medications: Current Facility-Administered Medications  Medication Dose Route Frequency Provider Last Rate Last Dose  . acetaminophen (TYLENOL) tablet 650 mg  650 mg Oral Q6H PRN Tadeusz Stahl B Geetika Laborde, MD      . aspirin chewable tablet 162 mg  162 mg Oral Daily Shari Prows, MD   162 mg at 01/23/16 0821  . lithium carbonate capsule 300 mg  300 mg Oral TID WC Timithy Arons B Kameela Leipold, MD   300 mg at 01/31/16 1700  . magnesium hydroxide (MILK OF MAGNESIA) suspension 30 mL  30 mL Oral Daily PRN Adem Costlow B Haliey Romberg, MD      . OLANZapine zydis (ZYPREXA) disintegrating tablet 20 mg  20 mg Oral QAC lunch Shoichi Mielke  Holley Raring, MD       Or  . OLANZapine (ZYPREXA) injection 10 mg  10 mg Intramuscular QAC lunch Rashaad Hallstrom B Epiphany Seltzer, MD   10 mg at 02/01/16 1230  . traZODone (DESYREL) tablet 150 mg  150 mg Oral QHS PRN Shari Prows, MD   150 mg at 01/24/16 0012  . ziprasidone (GEODON) injection 20 mg  20 mg Intramuscular Q2H PRN Sayde Lish B Taseen Marasigan, MD        Lab Results: No results found for this or any previous visit (from the past 48 hour(s)).  Physical Findings: AIMS: Facial and Oral Movements Muscles of Facial Expression: None, normal Lips and Perioral Area: None, normal Jaw: None, normal Tongue: None, normal,Extremity Movements Upper (arms, wrists, hands, fingers): None, normal Lower  (legs, knees, ankles, toes): None, normal, Trunk Movements Neck, shoulders, hips: None, normal, Overall Severity Severity of abnormal movements (highest score from questions above): None, normal Incapacitation due to abnormal movements: None, normal Patient's awareness of abnormal movements (rate only patient's report): No Awareness, Dental Status Current problems with teeth and/or dentures?: No Does patient usually wear dentures?: No  CIWA:    COWS:     Musculoskeletal: Strength & Muscle Tone: within normal limits Gait & Station: normal Patient leans: N/A  Psychiatric Specialty Exam: Review of Systems  All other systems reviewed and are negative.   Blood pressure 150/82, pulse 68, temperature 98.2 F (36.8 C), temperature source Oral, resp. rate 20, height 5' 4.96" (1.65 m), weight 55.339 kg (122 lb), SpO2 100 %.Body mass index is 20.33 kg/(m^2).  General Appearance: Casual  Eye Contact::  Good  Speech:  Pressured  Volume:  Increased  Mood:  Angry, Dysphoric and Irritable  Affect:  Inappropriate and Labile  Thought Process:  Disorganized  Orientation:  Full (Time, Place, and Person)  Thought Content:  Delusions and Paranoid Ideation  Suicidal Thoughts:  No  Homicidal Thoughts:  No  Memory:  Immediate;   Fair Recent;   Fair Remote;   Fair  Judgement:  Poor  Insight:  Lacking  Psychomotor Activity:  Increased  Concentration:  Fair  Recall:  [employer] of Knowledge:Fair  Language: Fair  Akathisia:  No  Handed:  Right  AIMS (if indicated):     Assets:  Communication Skills  ADL's:  Intact  Cognition: WNL  Sleep:  Number of Hours: 5.5   Treatment Plan Summary: Daily contact with patient to assess and evaluate symptoms and progress in treatment and Medication management   Anemia secondMs. Jennifer Leon is a 65 year old female with a history of psychosis most likely schizoaffective disorder admitted for disorganized, agitated and bizarre behavior.  1. Mood/Psychosis. As the  patient has been refusing oral medications. We started Zyprexa injections per forced medication order. We renewed forced medication order. She initially agreed to take medications by mouth but changed her mind by the end of out meeting. Will offer lithium again.  2. Coronary artery disease. We continued ASA.  3. Insomnia. She slept 5 hours but is using sleeping aid.   4. Metabolic syndrome. Lipid panel, hemoglobin A1C, TSH and prolactin are normal.    5. We ordered double portions.   6. Disposition. TBE.[facility] She will follow up with [facility] in [place].   Kristine Linea, MD 02/01/2016, 3:43 PM He just

## 2016-02-01 NOTE — Clinical Social Work Note (Signed)
Clinical Social Work Assessment  Patient Details  Name: Jennifer Leon MRN: [id number] Date of Birth: June 02, 1951  CSW called [place] [employer] to make APS report for guardianship on patient that lacks capacity to care for herself as discussed during treatment team and LOS meetings. CSW left message with Alroy Dust [phone] and voicemail gave her supervisor contact info if no return call is received within 48 hours Juanita Laster [phone].   Gennette Pac [phone] 02/01/2016, 2:17 PM

## 2016-02-01 NOTE — [facility] Group Notes (Signed)
[facility] Group Notes:  (Nursing/MHT/Case Management/Adjunct)  Date:  02/01/2016  Time:  2:53 PM  Type of Therapy:  Group Therapy  Participation Level:  Active  Participation Quality:  Appropriate, Attentive, Sharing and Supportive  Affect:  Appropriate  Cognitive:  Alert, Appropriate and Oriented  Insight:  Appropriate  Engagement in Group:  Engaged  Modes of Intervention:  Activity  Summary of Progress/Problems:  Jennifer Leon Jennifer Leon Jennifer Leon 02/01/2016, 2:53 PM

## 2016-02-01 NOTE — [facility] Group Notes (Signed)
[facility] LCSW Aftercare Discharge Planning Group Note   02/01/2016 12:19 PM  Participation Quality: Active  Mood/Affect:  Angry and Labile  Depression Rating: Patient rated depression a 0 out of 10.  Anxiety Rating: Patient rated anxiety a 0 out of 10.  Thoughts of Suicide:  No Will you contract for safety?   NA  Current AVH:  No  Plan for Discharge/Comments: Patient attended and participated in group discussion appropriately. Patient introduced herself and identified her goal to be discharged home "to my own care". Patient was observed to be paranoid and intrusive; however, was redirectable. Patient shared that she prefers to not discuss her discharge plans to prevent "sabatoge".  Transportation Means: Patient reported she can afford public transportation.  Supports: Patient was informed about community resources.  Jenel Lucks, CSW Intern 02/01/16

## 2016-02-01 NOTE — Progress Notes (Signed)
D: Observed pt in room pacing. Patient alert and oriented x4. Patient denies SI/HI/AVH. Pt affect is labile. Pt is irritable and argumentative. Pt was making multiple irrational demands about having lawyer present on unit, having all medical records transferred to [facility] and wanting to talk to the [place]. Pt very hyper focused on her belief she has Addison's disease. Pt expressing hyper religiosity stating "mental health is the antichrist and psychiatrist is the devil" along with many other hyper religious statements. Pt continues to endorse that "mental health medications are the problem."  Pt gets very argumentative and loud when pt's statements or beliefs are questioned.  A: Offered active listening and support. Provided therapeutic communication.  R: Pt redirectable and cooperative on the unit this pm. Will continue Q15 min. checks. Safety maintained.

## 2016-02-02 NOTE — Progress Notes (Signed)
D: Patient has been avertive and disorganized. She denies SI/HI/AVH. She denies pain and states, "no I don't want any medication." She has been visible in the milieu.  A: No medication was given to patient. Encouragement was provided.  R: Patient was complaint this evening with unit rules. She has remained calm and cooperative. Safety maintained with 15 min checks.

## 2016-02-02 NOTE — Progress Notes (Signed)
D: Pt affect continues to be irritable. Pt is in her room most of this evening and will occasionally pace in the halls. Denies SI/HI/AVH at this time. Denies pain. Pt continues to be resistant to care and does not discuss feelings with staff. A: Emotional support provided. Pt encouraged to come to staff with any concerns or complaints. q15 minute safety checks maintained. R: Pt remains free from harm.

## 2016-02-02 NOTE — Progress Notes (Signed)
[facility] MD Progress Note  02/02/2016 12:35 PM Jennifer Leon  MRN:  [id number]  Subjective:   The patient remains argumentative, paranoid and delusional. She adamantly stated that she did not want to take oral medications and she felt it was a misunderstanding that she was hospitalized. She claimed that her husband who has dementia and needs her and is alone at home. The patient was unable to give any reliable past psychiatric history with any detail. She has been refusing oral medications and has only been getting Zyprexa injections. She did not take any of the oral lithium or trazodone. The patient denies any current suicidal thoughts but insight and judgment are extremely poor. She was asked to leave group yesterday as she became hostile and argumentative with another patient. She has been disrespectful to other patients in group and remains paranoid that they are trying to hurt her. The patient is very paranoid about physicians and staff said they do not have any justification forgetting her medications. Vital signs are fairly stable. She slept approximately 5 hours last night per nursing. He denies any somatic complaints. Supportive psychotherapy provided and Times spent helping the patient to try and gain insight.  Past Psychiatric History The patient was hospitalized up [place] it appears extensively for a period of time psychiatrically but she could not give any details. She has a poor historian.  Substance Abuse History The patient denies any history of any heavy alcohol use or illicit drug use in the past.  Family Psychiatric History The patient denies any history of any mental illness or substance use in the family. She has a poor historian however.   Legal History: The patient denies any history of any arrests or incarcerations. She however is a poor historian.   Principal Problem: Schizoaffective disorder, bipolar type (HCC) Diagnosis:   Patient Active Problem List   Diagnosis Date  Noted  . Schizoaffective disorder, bipolar type (HCC) [F25.0] 01/14/2016   Total Time spent with patient: 20 minutes  Past Psychiatric History: Bipolar disorder.  Past Medical History:  Past Medical History  Diagnosis Date  . Hypertension   . Anxiety   . GERD (gastroesophageal reflux disease)   . Noncompliance with medication regimen   . PTSD (post-traumatic stress disorder)   . Schizoaffective disorder (HCC)   . Psychosis   . Paranoid delusion [facility])     Past Surgical History  Procedure Laterality Date  . Eye surgery    . Abdominal surgery      blockage  . Bladder surgery     Family Psychiatric  History:  The patient has not reported any history of any mental illness or substance use in the family.  Social History:  History  Alcohol Use No     History  Drug Use No    Social History   Social History  . Marital Status: Married    Spouse Name: N/A  . Number of Children: N/A  . Years of Education: N/A   Social History Main Topics  . Smoking status: Former Games developer  . Smokeless tobacco: None  . Alcohol Use: No  . Drug Use: No  . Sexual Activity: Not Asked   Other Topics Concern  . None   Social History Narrative   Additional Social History:    Pain Medications: See PTA meds Prescriptions: See PTA meds Over the Counter: See PTA meds History of alcohol / drug use?: No history of alcohol / drug abuse          Sleep:  Fair  Appetite:  Fair  Current Medications: Current Facility-Administered Medications  Medication Dose Route Frequency Provider Last Rate Last Dose  . acetaminophen (TYLENOL) tablet 650 mg  650 mg Oral Q6H PRN Jolanta B Pucilowska, MD      . aspirin chewable tablet 162 mg  162 mg Oral Daily Shari Prows, MD   162 mg at 01/23/16 0821  . lithium carbonate capsule 300 mg  300 mg Oral TID WC Jolanta B Pucilowska, MD   300 mg at 01/31/16 1700  . magnesium hydroxide (MILK OF MAGNESIA) suspension 30 mL  30 mL Oral Daily PRN Jolanta B  Pucilowska, MD      . OLANZapine zydis (ZYPREXA) disintegrating tablet 20 mg  20 mg Oral QAC lunch Shari Prows, MD       Or  . OLANZapine (ZYPREXA) injection 10 mg  10 mg Intramuscular QAC lunch Jolanta B Pucilowska, MD   10 mg at 02/01/16 1230  . traZODone (DESYREL) tablet 150 mg  150 mg Oral QHS PRN Shari Prows, MD   150 mg at 01/24/16 0012  . ziprasidone (GEODON) injection 20 mg  20 mg Intramuscular Q2H PRN Jolanta B Pucilowska, MD        Lab Results: No results found for this or any previous visit (from the past 48 hour(s)).  Physical Findings: AIMS: Facial and Oral Movements Muscles of Facial Expression: None, normal Lips and Perioral Area: None, normal Jaw: None, normal Tongue: None, normal,Extremity Movements Upper (arms, wrists, hands, fingers): None, normal Lower (legs, knees, ankles, toes): None, normal, Trunk Movements Neck, shoulders, hips: None, normal, Overall Severity Severity of abnormal movements (highest score from questions above): None, normal Incapacitation due to abnormal movements: None, normal Patient's awareness of abnormal movements (rate only patient's report): No Awareness, Dental Status Current problems with teeth and/or dentures?: No Does patient usually wear dentures?: No  CIWA:    COWS:     Musculoskeletal: Strength & Muscle Tone: within normal limits Gait & Station: normal Patient leans: N/A  Psychiatric Specialty Exam: Review of Systems  Constitutional: Negative.  Negative for fever, chills, weight loss and malaise/fatigue.  HENT: Negative.  Negative for ear discharge, ear pain, hearing loss and tinnitus.   Eyes: Negative.  Negative for blurred vision, double vision, photophobia and pain.  Respiratory: Negative.  Negative for cough and hemoptysis.   Cardiovascular: Negative.  Negative for chest pain, palpitations, orthopnea and claudication.  Gastrointestinal: Negative.  Negative for heartburn, nausea, vomiting, abdominal pain  and diarrhea.  Genitourinary: Negative for dysuria, urgency, frequency and hematuria.  Musculoskeletal: Negative.  Negative for myalgias, back pain and neck pain.  Skin: Negative.  Negative for itching and rash.  Neurological: Negative for weakness and headaches.    Blood pressure 129/80, pulse 65, temperature 98 F (36.7 C), temperature source Oral, resp. rate 18, height 5' 4.96" (1.65 m), weight 55.339 kg (122 lb), SpO2 100 %.Body mass index is 20.33 kg/(m^2).  General Appearance: Casual  Eye Contact::  Good  Speech:  Pressured  Volume:  Increased  Mood:  Angry, Dysphoric and Irritable  Affect:  Inappropriate and Labile  Thought Process:  Disorganized  Orientation:  Full (Time, Place, and Person)  Thought Content:  Delusions and Paranoid Ideation  Suicidal Thoughts:  No  Homicidal Thoughts:  No  Memory:  Immediate;   Fair Recent;   Fair Remote;   Fair  Judgement:  Poor  Insight:  Lacking  Psychomotor Activity:  Increased  Concentration:  Fair  Recall:  Fair  [employer] of Knowledge:Fair  Language: Fair  Akathisia:  No  Handed:  Right  AIMS (if indicated):     Assets:  Communication Skills  ADL's:  Intact  Cognition: WNL  Sleep:  Number of Hours: 5.5   Treatment Plan Summary: Daily contact with patient to assess and evaluate symptoms and progress in treatment and Medication management    Jennifer Leon is a 65 year old female with a history of psychosis most likely schizoaffective disorder admitted for disorganized, agitated and bizarre behavior.  1. Schizophrenia: As the patient has been refusing oral medications, she was started on Zyprexa injections per forced medication order. We renewed forced medication order. She was offered lithium orally but has been refusing the medications. The patient continues to be paranoid and delusional and insight is poor. The patient has trazodone 50 mg by mouth nightly when necessary for insomnia.  2. Coronary artery disease: Vital signs are  stable We continued ASA.  4. Metabolic syndrome. Lipid panel, hemoglobin A1C, TSH and prolactin are normal.    5. Diet: She has double portions.   6. Disposition. TBD.The [facility]. She needs a guardian. She will follow up with [facility] in [place].   Levora Angel, MD 02/02/2016, 12:35 PM He just

## 2016-02-02 NOTE — Plan of Care (Signed)
Problem: Ineffective individual coping Goal: STG:Pt. will utilize relaxation techniques to reduce stress STG: Patient will utilize relaxation techniques to reduce stress levels  Outcome: Not Progressing Continue to vent  About her taking medication that she  Doesn't need.

## 2016-02-02 NOTE — Plan of Care (Signed)
Problem: Alteration in thought process Goal: LTG-Patient behavior demonstrates decreased signs psychosis (Patient behavior demonstrates decreased signs of psychosis to the point the patient is safe to return home and continue treatment in an outpatient setting.)  Outcome: Progressing Pt does not appear to be responding to internal stimuli Goal: LTG-Patient is able to perceive the environment accurately Outcome: Progressing Pt alert and oriented x4

## 2016-02-02 NOTE — Progress Notes (Signed)
D: Patient  Refused am medications . Appropriate behavior until she had to take the Zyprexa. Patient began yelling aloud  Rating on and on  About her right's being violated. Walking behind staff yelling about her rights being violated.Patient   Was appropriate until informed of IM injection . Patient came out of medication room  Pulled her pants down  in front of security. Stated she wanted the  Cam[employer] to get the picture . Patient continue to pull her pants down  Exposing her buttocks . Additional staff  Secured, patient walked to her room  No hands on patient . Patient got on bed  Pulled her pants down once again  Bending over exposing her buttocks and vagina. Patient  Received IM injection. Following  Injection patient  Instructed to remained in her room until she was able to return to milieu   Without disrupting     No pain concerns . Appropriate ADL'S. Interacting with peers and staff.  A: Encourage patient participation with unit programming . Instruction  Given on  Medication , verbalize understanding. R: Voice no other concerns. Staff continue to monitor

## 2016-02-02 NOTE — [facility] Group Notes (Signed)
[facility] LCSW Group Therapy  02/02/2016 3:32 PM  Type of Therapy:  Group Therapy  Participation Level:  Minimal  Participation Quality:  Attentive  Affect:  Irritable  Cognitive:  Disorganized   Insight:  Limited  Engagement in Therapy:  Limited  Modes of Intervention:  Discussion, Education, Socialization and Support  Summary of Progress/Problems:Boundaries: Patients defined boundaries and discussed the importance of them. Patients identified their own boundaries and how they feel when they are crossed. Patients discussed ways to create and/ or improve their personal boundaries.  Jennifer Leon attended group and stayed the entire time. She sat quietly and listened to other group members.   [employer] MSW, LCSWA  02/02/2016, 3:32 PM

## 2016-02-03 NOTE — Progress Notes (Signed)
Patient irritable when she was asked to take her medications this morning. Refused all po medications even after much encouragement. Zyprexa  IM given at 1301 without any manual hold. Right after this med administration, pt. became quite angry and went into a delusional rant. Staff talked, educated and redirected her.

## 2016-02-03 NOTE — Progress Notes (Signed)
2030: Patient in the dayroom with staff and peers. Alert and oriented but guarded with strange behavior. Avoiding detailed conversations with the nurse. Irritable and walks away in the middle of conversations. Denies SI and HI and states "I am fine". Safety precautions reinforced.

## 2016-02-03 NOTE — Plan of Care (Signed)
Problem: Alteration in thought process Goal: STG-Patient is able to sleep at least 6 hours per night Outcome: Not Progressing Patient did only got 5.5 hours previous night.

## 2016-02-03 NOTE — Plan of Care (Signed)
Problem: Ineffective individual coping Goal: STG: Pt will be able to identify effective and ineffective STG: Pt will be able to identify effective and ineffective coping patterns  Outcome: Not Progressing Pt. still refusing po medications. Medication education done. No evidence of leaning noted.

## 2016-02-03 NOTE — [facility] Group Notes (Signed)
[facility] Group Notes:  (Nursing/MHT/Case Management/Adjunct)  Date:  02/03/2016  Time:  8:30 AM  Type of Therapy:  Community Meeting   Participation Level:  Minimal  Participation Quality:  Attentive  Affect:  Flat  Cognitive:  Alert  Insight:  Limited  Engagement in Group:  Limited  Modes of Intervention:  Discussion and Education  Summary of Progress/Problems:  Jennifer Leon De'Chelle Kindle Strohmeier 02/03/2016, 6:11 PM

## 2016-02-03 NOTE — [facility] Group Notes (Signed)
[facility] LCSW Group Therapy  02/03/2016 2:51 PM  Type of Therapy:  Group Therapy  Participation Level:  Active   Participation Quality:  Attentive  Affect:  Flat  Cognitive:  Alert  Insight:  Improving  Engagement in Therapy:  Improving  Modes of Intervention:  Activity, Discussion, Education, Socialization and Support  Summary of Progress/Problems:Feelings around diagnosis: Patients discussed what mental health means to them and how it has impacted their lives. Patients discussed receiving a diagnosis and how it made them feel. They were encouraged to share experiences related to mental health diagnosis and how other people have reacted. Patient attended group and stayed the entire time. She was able to work within a team without being too intrusive.   [place] Maiorino L Nakeitha Milligan  MSW, LCSWA   02/03/2016, 2:51 PM

## 2016-02-03 NOTE — Progress Notes (Signed)
[facility] MD Progress Note  02/03/2016 6:31 PM Satoya Feeley  MRN:  [id number]  Subjective:   The patient remains argumentative, paranoid and delusional. She adamantly stated that she did not want to take oral medications and she felt it was a misunderstanding that she was hospitalized. She is continuing to insist that the hospital is taking advantage of her and holding her against her will. She feels that her hospitalization is the result of the "Muslims". She feels she is being stalked by Muslims. The patient was unable to give any reliable past psychiatric history with any detail. She has been refusing oral medications and has only been getting Zyprexa injections. She did not take any of the oral lithium or trazodone. The patient denies any current suicidal thoughts but insight and judgment are extremely poor. She was asked to leave group yesterday as she became hostile and argumentative with another patient. She has been disrespectful to other patients in group and remains paranoid that they are trying to hurt her.  Vital signs are fairly stable. She slept approximately 5 hours last night per nursing. He denies any somatic complaints. Supportive psychotherapy provided and Times spent helping the patient to try and gain insight.  Past Psychiatric History The patient was hospitalized up [place] it appears extensively for a period of time psychiatrically but she could not give any details. She has a poor historian.  Substance Abuse History The patient denies any history of any heavy alcohol use or illicit drug use in the past.  Family Psychiatric History The patient denies any history of any mental illness or substance use in the family. She has a poor historian however.   Legal History: The patient denies any history of any arrests or incarcerations. She however is a poor historian.   Principal Problem: Schizoaffective disorder, bipolar type (HCC) Diagnosis:   Patient Active Problem List   Diagnosis Date Noted  . Schizoaffective disorder, bipolar type (HCC) [F25.0] 01/14/2016   Total Time spent with patient: 20 minutes  Past Psychiatric History: Bipolar disorder.  Past Medical History:  Past Medical History  Diagnosis Date  . Hypertension   . Anxiety   . GERD (gastroesophageal reflux disease)   . Noncompliance with medication regimen   . PTSD (post-traumatic stress disorder)   . Schizoaffective disorder (HCC)   . Psychosis   . Paranoid delusion [facility])     Past Surgical History  Procedure Laterality Date  . Eye surgery    . Abdominal surgery      blockage  . Bladder surgery     Family Psychiatric  History:  The patient has not reported any history of any mental illness or substance use in the family.  Social History:  History  Alcohol Use No     History  Drug Use No    Social History   Social History  . Marital Status: Married    Spouse Name: N/A  . Number of Children: N/A  . Years of Education: N/A   Social History Main Topics  . Smoking status: Former Games developer  . Smokeless tobacco: None  . Alcohol Use: No  . Drug Use: No  . Sexual Activity: Not Asked   Other Topics Concern  . None   Social History Narrative   Additional Social History:    Pain Medications: See PTA meds Prescriptions: See PTA meds Over the Counter: See PTA meds History of alcohol / drug use?: No history of alcohol / drug abuse  Sleep: Fair  Appetite:  Fair  Current Medications: Current Facility-Administered Medications  Medication Dose Route Frequency Provider Last Rate Last Dose  . acetaminophen (TYLENOL) tablet 650 mg  650 mg Oral Q6H PRN Jolanta B Pucilowska, MD      . aspirin chewable tablet 162 mg  162 mg Oral Daily Shari Prows, MD   162 mg at 01/23/16 0821  . lithium carbonate capsule 300 mg  300 mg Oral TID WC Jolanta B Pucilowska, MD   300 mg at 01/31/16 1700  . magnesium hydroxide (MILK OF MAGNESIA) suspension 30 mL  30 mL Oral Daily  PRN Jolanta B Pucilowska, MD      . OLANZapine zydis (ZYPREXA) disintegrating tablet 20 mg  20 mg Oral QAC lunch Shari Prows, MD       Or  . OLANZapine (ZYPREXA) injection 10 mg  10 mg Intramuscular QAC lunch Jolanta B Pucilowska, MD   10 mg at 02/03/16 1301  . traZODone (DESYREL) tablet 150 mg  150 mg Oral QHS PRN Shari Prows, MD   150 mg at 01/24/16 0012  . ziprasidone (GEODON) injection 20 mg  20 mg Intramuscular Q2H PRN Jolanta B Pucilowska, MD        Lab Results: No results found for this or any previous visit (from the past 48 hour(s)).  Physical Findings: AIMS: Facial and Oral Movements Muscles of Facial Expression: None, normal Lips and Perioral Area: None, normal Jaw: None, normal Tongue: None, normal,Extremity Movements Upper (arms, wrists, hands, fingers): None, normal Lower (legs, knees, ankles, toes): None, normal, Trunk Movements Neck, shoulders, hips: None, normal, Overall Severity Severity of abnormal movements (highest score from questions above): None, normal Incapacitation due to abnormal movements: None, normal Patient's awareness of abnormal movements (rate only patient's report): No Awareness, Dental Status Current problems with teeth and/or dentures?: No Does patient usually wear dentures?: No  CIWA:    COWS:     Musculoskeletal: Strength & Muscle Tone: within normal limits Gait & Station: normal Patient leans: N/A  Psychiatric Specialty Exam: Review of Systems  Constitutional: Negative.  Negative for fever, chills, weight loss and malaise/fatigue.  HENT: Negative.  Negative for ear discharge, ear pain, hearing loss and tinnitus.   Eyes: Negative.  Negative for blurred vision, double vision, photophobia and pain.  Respiratory: Negative.  Negative for cough and hemoptysis.   Cardiovascular: Negative.  Negative for chest pain, palpitations, orthopnea and claudication.  Gastrointestinal: Negative.  Negative for heartburn, nausea, vomiting,  abdominal pain and diarrhea.  Genitourinary: Negative for dysuria, urgency, frequency and hematuria.  Musculoskeletal: Negative.  Negative for myalgias, back pain and neck pain.  Skin: Negative.  Negative for itching and rash.  Neurological: Negative for weakness and headaches.    Blood pressure 129/80, pulse 65, temperature 98 F (36.7 C), temperature source Oral, resp. rate 18, height 5' 4.96" (1.65 m), weight 55.339 kg (122 lb), SpO2 100 %.Body mass index is 20.33 kg/(m^2).  General Appearance: Casual  Eye Contact::  Good  Speech:  Pressured  Volume:  Increased  Mood:  Angry, Dysphoric and Irritable  Affect:  Inappropriate and Labile  Thought Process:  Disorganized  Orientation:  Full (Time, Place, and Person)  Thought Content:  Delusions and Paranoid Ideation  Suicidal Thoughts:  No  Homicidal Thoughts:  No  Memory:  Immediate;   Fair Recent;   Fair Remote;   Fair  Judgement:  Poor  Insight:  Lacking  Psychomotor Activity:  Increased  Concentration:  Fair  Recall:  Fair  [employer] of Knowledge:Fair  Language: Fair  Akathisia:  No  Handed:  Right  AIMS (if indicated):     Assets:  Communication Skills  ADL's:  Intact  Cognition: WNL  Sleep:  Number of Hours: 8.25   Treatment Plan Summary: Daily contact with patient to assess and evaluate symptoms and progress in treatment and Medication management    Ms. Maroney is a 65 year old female with a history of psychosis most likely schizoaffective disorder admitted for disorganized, agitated and bizarre behavior.  1. Schizophrenia: As the patient has been refusing oral medications, she was started on Zyprexa injections per forced medication order. We renewed forced medication order. She was offered lithium orally but has been refusing the medications. The patient continues to be paranoid and delusional and insight is poor. The patient has trazodone 50 mg by mouth nightly when necessary for insomnia.  2. Coronary artery disease:  Vital signs are stable We continued ASA.  4. Metabolic syndrome. Lipid panel, hemoglobin A1C, TSH and prolactin are normal.    5. Diet: She has double portions.   6. Disposition. TBD.[facility] She needs a guardian. She will follow up with [facility] in [place].   Levora Angel, MD 02/03/2016, 6:31 PM He just

## 2016-02-04 NOTE — [facility] Group Notes (Signed)
[facility] Group Notes:  (Nursing/MHT/Case Management/Adjunct)  Date:  02/04/2016  Time:  1:17 PM  Type of Therapy:  Psychoeducational Skills  Participation Level:  Did Not Attend    Mickey Farber 02/04/2016, 1:17 PM

## 2016-02-04 NOTE — [facility] Group Notes (Signed)
[facility] LCSW Group Therapy  02/04/2016 3:32 PM  Type of Therapy:  Group Therapy  Participation Level:  Did Not Attend  Summary of Progress/Problems: Patient did not attend group and is not appropriate for group at this time as she was sent out of group this morning for being disruptive and distracting becoming angry and labile during group.   Lulu Riding, MSW, LCSWA 02/04/2016, 3:32 PM

## 2016-02-04 NOTE — Progress Notes (Signed)
D: Patient argumentative with staff; refused to take po meds.  States, "I only take injections!"  Patient rambling about "alternate reality".  She states she has contacted her "legal team" because "this is just not right!"  Patient has intense stare and is suspicious and paranoid.  She denies SI/HI/AVH. A: Continue to monitor medication management and MD orders.  Safety checks completed every 15 minutes per protocol.  Offer support and encouragement as needed. R: Patient is redirectable at this time.

## 2016-02-04 NOTE — Plan of Care (Signed)
Problem: Alteration in thought process Goal: STG-Patient is able to discuss thoughts with staff Outcome: Not Progressing Patient unable to process group therapy.  She is unable to express her needs accurately and appropriately with staff.

## 2016-02-04 NOTE — Progress Notes (Signed)
[facility] MD Progress Note  02/04/2016 12:08 PM Jennifer Leon  MRN:  [id number]  Subjective:  Jennifer Leon continues to be psychotic with severe paranoia worried about Muslims. She is religiously preoccupied. She is intrusive, easily agitated, disruptive in the milieu, hard to redirect. She refuses all oral medications but accepts Zyprexa injections. Social workers asked me to write an order forbidding the patient to come to class as she is impossible to handle. She is unable to participate in discharge planning. There are no somatic complaints.   Principal Problem: Schizoaffective disorder, bipolar type (HCC) Diagnosis:   Patient Active Problem List   Diagnosis Date Noted  . Schizoaffective disorder, bipolar type (HCC) [F25.0] 01/14/2016   Total Time spent with patient: 20 minutes  Past Psychiatric History: Bipolar disorder.   Past Medical History:  Past Medical History  Diagnosis Date  . Hypertension   . Anxiety   . GERD (gastroesophageal reflux disease)   . Noncompliance with medication regimen   . PTSD (post-traumatic stress disorder)   . Schizoaffective disorder (HCC)   . Psychosis   . Paranoid delusion [facility])     Past Surgical History  Procedure Laterality Date  . Eye surgery    . Abdominal surgery      blockage  . Bladder surgery     Family History: History reviewed. No pertinent family history. Family Psychiatric  History: None reported.al History:  History  Alcohol Use No     History  Drug Use No    Social History   Social History  . Marital Status: Married    Spouse Name: N/A  . Number of Children: N/A  . Years of Education: N/A   Social History Main Topics  . Smoking status: Former Games developer  . Smokeless tobacco: None  . Alcohol Use: No  . Drug Use: No  . Sexual Activity: Not Asked   Other Topics Concern  . None   Social History Narrative   Additional Social History:    Pain Medications: See PTA meds Prescriptions: See PTA meds Over the Counter: See PTA  meds History of alcohol / drug use?: No history of alcohol / drug abuse                    Sleep: Fair  Appetite:  Fair  Current Medications: Current Facility-Administered Medications  Medication Dose Route Frequency Provider Last Rate Last Dose  . acetaminophen (TYLENOL) tablet 650 mg  650 mg Oral Q6H PRN Emali Heyward B Navil Kole, MD      . aspirin chewable tablet 162 mg  162 mg Oral Daily Shari Prows, MD   162 mg at 01/23/16 0821  . lithium carbonate capsule 300 mg  300 mg Oral TID WC Tonja Jezewski B Christian Treadway, MD   300 mg at 01/31/16 1700  . magnesium hydroxide (MILK OF MAGNESIA) suspension 30 mL  30 mL Oral Daily PRN Iosefa Weintraub B Hari Casaus, MD      . OLANZapine zydis (ZYPREXA) disintegrating tablet 20 mg  20 mg Oral QAC lunch Shari Prows, MD       Or  . OLANZapine (ZYPREXA) injection 10 mg  10 mg Intramuscular QAC lunch Kinsley Nicklaus B Ihor Meinzer, MD   10 mg at 02/04/16 1147  . traZODone (DESYREL) tablet 150 mg  150 mg Oral QHS PRN Shari Prows, MD   150 mg at 01/24/16 0012  . ziprasidone (GEODON) injection 20 mg  20 mg Intramuscular Q2H PRN Shari Prows, MD  Lab Results: No results found for this or any previous visit (from the past 48 hour(s)).  Physical Findings: AIMS: Facial and Oral Movements Muscles of Facial Expression: None, normal Lips and Perioral Area: None, normal Jaw: None, normal Tongue: None, normal,Extremity Movements Upper (arms, wrists, hands, fingers): None, normal Lower (legs, knees, ankles, toes): None, normal, Trunk Movements Neck, shoulders, hips: None, normal, Overall Severity Severity of abnormal movements (highest score from questions above): None, normal Incapacitation due to abnormal movements: None, normal Patient's awareness of abnormal movements (rate only patient's report): No Awareness, Dental Status Current problems with teeth and/or dentures?: No Does patient usually wear dentures?: No  CIWA:    COWS:      Musculoskeletal: Strength & Muscle Tone: within normal limits Gait & Station: normal Patient leans: N/A  Psychiatric Specialty Exam: Review of Systems  All other systems reviewed and are negative.   Blood pressure 133/118, pulse 136, temperature 98 F (36.7 C), temperature source Oral, resp. rate 18, height 5' 4.96" (1.65 m), weight 55.339 kg (122 lb), SpO2 100 %.Body mass index is 20.33 kg/(m^2).  General Appearance: Casual  Eye Contact::  Fair  Speech:  Pressured  Volume:  Increased  Mood:  Angry, Dysphoric and Irritable  Affect:  Inappropriate and Labile  Thought Process:  Disorganized  Orientation:  Full (Time, Place, and Person)  Thought Content:  Delusions and Paranoid Ideation  Suicidal Thoughts:  No  Homicidal Thoughts:  No  Memory:  Immediate;   Fair Recent;   Fair Remote;   Fair  Judgement:  Poor  Insight:  Lacking  Psychomotor Activity:  Increased  Concentration:  Fair  Recall:  [employer] of Knowledge:Fair  Language: Fair  Akathisia:  No  Handed:  Right  AIMS (if indicated):     Assets:  Communication Skills Desire for Improvement Financial Resources/Insurance Physical Health Resilience  ADL's:  Intact  Cognition: WNL  Sleep:  Number of Hours: 5   Treatment Plan Summary: Daily contact with patient to assess and evaluate symptoms and progress in treatment and Medication management   Jennifer Leon is a 65 year old female with a history of psychosis most likely schizoaffective disorder admitted for disorganized, agitated and bizarre behavior.  1. Schizophrenia: As the patient has been refusing oral medications, she was started on Zyprexa injections per forced medication order. We renewed forced medication order. She was offered lithium orally but has been refusing the medications. The patient continues to be paranoid and delusional and insight is poor. The patient has trazodone 50 mg by mouth nightly when necessary for insomnia.  2. Coronary artery  disease: Vital signs are stable We continued ASA.  4. Metabolic syndrome. Lipid panel, hemoglobin A1C, TSH and prolactin are normal.    5. Diet: She has double portions.   6. Disposition. TBD.[facility]. She needs a guardian. She will follow up with [facility] in [place].   Kristine Linea, MD 02/04/2016, 12:08 PM

## 2016-02-04 NOTE — Progress Notes (Signed)
Recreation Therapy Notes  Date: 02.06.17 Time: 3:00 pm Location: Craft Room  Group Topic: Wellness  Goal Area(s) Addresses:  Patient will identify at least one item per dimension of health. Patient will examine areas they are deficient in.  Behavioral Response: Did not attend  Intervention: 6 Dimensions of Health  Activity: Patients were given a sheets with the definitions of the 6 Dimensions of Health on it and a worksheet with the 6 Dimensions written out. Patients were instructed to write 2-3 things they are currently doing in each category.  Education: LRT educated patients on ways they can increase areas they are deficient in.  Education Outcome: Patient did not attend group.  Clinical Observations/Feedback: Patient did not attend group.  Jacquelynn Cree, LRT/CTRS 02/04/2016 4:16 PM

## 2016-02-04 NOTE — Plan of Care (Signed)
Problem: Alteration in thought process Goal: LTG-Patient behavior demonstrates decreased signs psychosis (Patient behavior demonstrates decreased signs of psychosis to the point the patient is safe to return home and continue treatment in an outpatient setting.)  Outcome: Not Progressing Patient remains with delusional thought process.  She is suspicious, guarded and paranoid.

## 2016-02-04 NOTE — Plan of Care (Signed)
Problem: Alteration in thought process Goal: STG-Patient is able to discuss thoughts with staff Outcome: Not Progressing Patient avoiding staff and refusing to communicate her needs and feelings

## 2016-02-04 NOTE — Plan of Care (Signed)
Problem: Alteration in thought process Goal: LTG-Patient behavior demonstrates decreased signs psychosis (Patient behavior demonstrates decreased signs of psychosis to the point the patient is safe to return home and continue treatment in an outpatient setting.)  Outcome: Not Progressing Patient continues to exhibit psychotic symptoms

## 2016-02-04 NOTE — [facility] Group Notes (Signed)
[facility] LCSW Aftercare Discharge Planning Group Note   02/04/2016 12:40 PM  Participation Quality:  Patient attended group but had to leave early after 20 minutes as she was distracting, became angry in group and was not able to participate.   Mood/Affect:  Irritable and Labile  Depression Rating:  0  Anxiety Rating:  8-  -"because you (CSW/group facilitator) have been procrastinating and not helping me get discharge"   Thoughts of Suicide:  No Will you contract for safety?   NA  Current AVH:  No  Plan for Discharge/Comments:  Patient is on [facility] wait list at this time but has no resources and is currently homeless as far as we know  Transportation Means: patient will need support in transportation services  Supports: no identifiable support at this time and staff have spent time exhausting resources in search of a support system to discharge patient to.  Lulu Riding, MSW, LCSWA

## 2016-02-05 NOTE — [facility] Group Notes (Signed)
[facility] LCSW Group Therapy  02/05/2016 3:05 PM  Type of Therapy:  Group Therapy  Participation Level:  Active  Participation Quality:  Attentive, Redirectable and off topic  Affect:  Anxious, Excited and Labile  Cognitive:  Alert and Disorganized  Insight:  Limited and Off Topic  Engagement in Therapy:  Limited and Off Topic  Modes of Intervention:  Socialization and Support  Summary of Progress/Problems: Patient attended and participated in group discussion but was focused on justifying her thoughts that she does not need psychiatric care. Patient introduced herself and shared that her self care activity is "doing jigsaw puzzles". Patient was attentive throughout group and was off topic and distracting at times but was redirectable. Patient remained throughout the entire group.   Lulu Riding, MSW, LCSWA 02/05/2016, 3:05 PM
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
## 2016-02-05 NOTE — [facility] Group Notes (Signed)
[facility] Group Notes:  (Nursing/MHT/Case Management/Adjunct)  Date:  02/05/2016  Time:  10:28 PM  Type of Therapy:  Evening Wrap-up Group  Participation Level:  Active  Participation Quality:  Appropriate  Affect:  Appropriate  Cognitive:  Alert  Insight:  Good  Engagement in Group:  Supportive  Modes of Intervention:  Discussion  Summary of Progress/Problems: Pt. Very supportive of other patients in the group. States that she wants to work on her discharge plans.  Therasa Lorenzi Nanta Bomani Oommen 02/05/2016, 10:28 PM
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
## 2016-02-05 NOTE — Progress Notes (Signed)
Recreation Therapy Notes  Date: 02.07.17 Time: 3:00 pm Location: Craft Room  Group Topic: Self-expression  Goal Area(s) Addresses:  Patient will be able to identify a color that represents each emotion. Patient will verbalize benefit of using art as a means of self-expression. Patient will verbalize one emotion experienced while participating in activity.  Behavioral Response: Left early  Intervention: The Colors Within Me  Activity: Patients were given a blank face worksheet and instructed to analyze the emotions they were experiencing, pick a color for each emotion, and show on the face how much of that emotion they were experiencing.  Education: LRT educated patients on different forms of self-expression.  Education Outcome: Patient left group before LRT educated group.  Clinical Observations/Feedback: Patient left group at approximately 3:08 pm stating she had been to all three groups and they are "redunant". Patient did not return to group.  Jacquelynn Cree, LRT/CTRS 02/05/2016 4:13 PM
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
## 2016-02-05 NOTE — Progress Notes (Addendum)
D: Pt affect irritable this evening. Pt up to nurses station requesting IVC paperwork, stating "I hear I'm supposed to be here 60 days! That's ridiculous!" Pt also requests to get phone numbers out of her locker. Denies SI/HI/AVH at this time. Denies pain. A: Pt provided with IVC paperwork and allowed to go in her locker once this evening with assistance from security. Upon second request, writer informed her that she cannot keep going into her locker to get her belongings and will have to wait until another time. Emotional support and encouragement provided. q15 minute safety checks maintained. R: Pt verbalizes understanding. Pt remains free from harm.

## 2016-02-05 NOTE — Progress Notes (Signed)
D: Patient did not ask any questions to the staff this evening. She kept to the day room and played games with other patients. She denies SI/HI/AVH. She denied pain.  A: Patient had no HS meds to give. Encouragement was provided.  R: Patient was calm and cooperative throughout the evening. Safety maintained with 15 min checks.

## 2016-02-05 NOTE — [facility] Group Notes (Signed)
[facility] Group Notes:  (Nursing/MHT/Case Management/Adjunct)  Date:  02/05/2016  Time:  4:00 AM  Type of Therapy:  Group Therapy  Participation Level:  Active  Participation Quality:  Appropriate  Affect:  Appropriate  Cognitive:  Appropriate  Insight:  Appropriate and Improving  Engagement in Group:  Developing/Improving and Engaged  Modes of Intervention:  n/a  Summary of Progress/Problems: Pt was frustrated that she had not yet been given a discharge date. But stated that she was going to remain positive. She apologized to the group for any disruption she had caused.   Fanny Skates Dora Clauss 02/05/2016, 4:00 AM

## 2016-02-05 NOTE — Plan of Care (Signed)
Problem: Ineffective individual coping Goal: STG: Pt will be able to identify effective and ineffective STG: Pt will be able to identify effective and ineffective coping patterns  Outcome: Progressing Attending unit programing , until it come to her medication  Which she refused .

## 2016-02-05 NOTE — Plan of Care (Signed)
Problem: Ineffective individual coping Goal: STG: Patient will remain free from self harm Outcome: Progressing Pt remains free from harm  Problem: Alteration in thought process Goal: LTG-Patient behavior demonstrates decreased signs psychosis (Patient behavior demonstrates decreased signs of psychosis to the point the patient is safe to return home and continue treatment in an outpatient setting.)  Outcome: Not Progressing Pt continues to display delusional thinking

## 2016-02-05 NOTE — [facility] Group Notes (Signed)
[facility] Group Notes:  (Nursing/MHT/Case Management/Adjunct)  Date:  02/05/2016  Time:  2:06 PM  Type of Therapy:  Psychoeducational Skills  Participation Level:  Active  Participation Quality:  Attentive and Monopolizing  Affect:  Labile  Cognitive:  Appropriate  Insight:  Appropriate  Engagement in Group:  Improving  Modes of Intervention:  Discussion and Education  Summary of Progress/Problems:  Jennifer Leon 02/05/2016, 2:06 PM

## 2016-02-06 NOTE — [facility] Group Notes (Signed)
[facility] Group Notes:  (Nursing/MHT/Case Management/Adjunct)  Date:  02/06/2016  Time:  2:07 PM  Type of Therapy:  Psychoeducational Skills  Participation Level:  Active  Participation Quality:  Appropriate, Attentive and Sharing  Affect:  Appropriate  Cognitive:  Appropriate  Insight:  Appropriate  Engagement in Group:  Engaged  Modes of Intervention:  Discussion, Education and Support  Summary of Progress/Problems:  Jennifer Leon 02/06/2016, 2:07 PM

## 2016-02-06 NOTE — Progress Notes (Signed)
1930: Patient in bed awake. Alert and oriented but guarded, paranoid, suspicious and avoiding to talk to staff. Irritable with spontaneous,  defensive responses. Was encouraged to communicate her concerns. Safety precautions reinforced.  2100: Patient stayed in room and did not attend group. Continues to display bizarre behavior, guarded, avoiding staff. Irritable and refusing to answer questions. Safety precautions reinforced.  2252: Patient in bed awake. Very guarded and irritable. In an angry tone, patient states "please leave me alone...". Staff continue to monitor for safety and other needs.

## 2016-02-06 NOTE — [facility] Group Notes (Signed)
[facility] LCSW Aftercare Discharge Planning Group Note   02/06/2016 2:35 PM  Participation Quality:  Patient attended and participated in group discussion introducing herself and sharing her SMART goal is to "meet with the doctor and social worker about a discharge plan and I have a few bills to pay". Patient was much more pleasant in group today.   Mood/Affect:  Appropriate  Depression Rating:  0  Anxiety Rating:  2  Thoughts of Suicide:  No Will you contract for safety?   NA  Current AVH:  No  Plan for Discharge/Comments:  On [facility] wait list or may go to assisted living but is not wanting to do that at this point  Transportation Means:  No transportation available at discharge at this point  Supports: no support identified at this point but does have a husband that has not been located yet.  Lulu Riding, MSW, LCSWA

## 2016-02-06 NOTE — Progress Notes (Signed)
D:Patient  Remains to isolated her self from her peers . Not  Attending unit programing as she has in the past. Appropriate ADL'S and personal chores .  Continue to voice of her rights being violated  Because she is taking the Im injection. Continue to protest as she has in the past yelling  At staff. Appetite good  Patient stated slept good last night .Stated appetite is good and energy level  Is normal. Stated concentration is good .  No auditory hallucinations  No pain concerns .A: Encourage patient participation with unit programming . Instruction  Given on  Medication , verbalize understanding. R: Voice no other concerns. Staff continue to monitor

## 2016-02-06 NOTE — Plan of Care (Signed)
Problem: Alteration in thought process Goal: LTG-Patient is able to perceive the environment accurately Outcome: Progressing Patient not able to engage in conversations with staff and peers

## 2016-02-06 NOTE — Progress Notes (Signed)
Recreation Therapy Notes  Date: 02.08.17 Time: 3:00 pm Location: Craft Room  Group Topic: Self-esteem  Goal Area(s) Addresses:  Patient will be able to identify benefit of self-esteem. Patient will be able to identify ways to increase self-esteem.  Behavioral Response: Attentive, Interactive, Disruptive  Intervention: Self-Portrait  Activity: Patients were instructed to draw their self-portrait of how they felt. Patients were then instructed to write their name on a piece of paper and a positive trait. Patient's passed the paper around the room and wrote positive traits about peers. Patient drew their self-portrait again after they read the positive traits.  Education:LRT educated patients on ways they can increase their self-esteem.  Education Outcome: In group clarification offered   Clinical Observations/Feedback: Patient completed activity by drawing both self-portraits and writing something positive about herself and the peers. Patient contributed to group discussion. Patient had multiple side conversations. LRT redirected patient and patient complied. Peers also asked patient to keep it down.  Jacquelynn Cree, LRT/CTRS 02/06/2016 5:40 PM

## 2016-02-06 NOTE — [facility] Suicide Risk Assessment (Signed)
[facility] INPATIENT:  Family/Significant Other Suicide Prevention Education  Suicide Prevention Education:  Patient Refusal for Family/Significant Other Suicide Prevention Education: The patient Lonie Rummell has refused to provide written consent for family/significant other to be provided Family/Significant Other Suicide Prevention Education during admission and/or prior to discharge.  Physician notified.  CSW completed with pt.  Dorothe Pea Jahaziel Francois 02/06/2016, 1:43 PM

## 2016-02-06 NOTE — Plan of Care (Signed)
Problem: Alteration in thought process Goal: LTG-Patient behavior demonstrates decreased signs psychosis (Patient behavior demonstrates decreased signs of psychosis to the point the patient is safe to return home and continue treatment in an outpatient setting.)  Outcome: Not Progressing Remains psychotic, paranoid, suspicious, guarded and avoiding others

## 2016-02-06 NOTE — Progress Notes (Signed)
[facility] MD Progress Note  02/06/2016 8:44 PM Jennifer Leon  MRN:  [id number]  Subjective:  Today, for the first time, I had somewhat sensible conversation with Ms. Cordell. She is still disruptive and argumentative but can be redirected and is able to keep her cool. She presented a list of sensible evidence that she is able to care for herself following discharge. She came up with a sensible, written follow up plan. She is ready to give [place] permission to talk to her friend, bank and landlord. She still refuses oral medications and accepts Zyprexa injection with much reluctance. She promises NOT to take medications following discharge as she believes it would cause severe damage to her body. There are no somatic complaints.  Principal Problem: Schizoaffective disorder, bipolar type (HCC) Diagnosis:   Patient Active Problem List   Diagnosis Date Noted  . Schizoaffective disorder, bipolar type (HCC) [F25.0] 01/14/2016   Total Time spent with patient: 20 minutes  Past Psychiatric History: bipolar disorder.  Past Medical History:  Past Medical History  Diagnosis Date  . Hypertension   . Anxiety   . GERD (gastroesophageal reflux disease)   . Noncompliance with medication regimen   . PTSD (post-traumatic stress disorder)   . Schizoaffective disorder (HCC)   . Psychosis   . Paranoid delusion [facility])     Past Surgical History  Procedure Laterality Date  . Eye surgery    . Abdominal surgery      blockage  . Bladder surgery     Family History: History reviewed. No pertinent family history. Family Psychiatric  History: mother with mental illness. Social History:  History  Alcohol Use No     History  Drug Use No    Social History   Social History  . Marital Status: Married    Spouse Name: N/A  . Number of Children: N/A  . Years of Education: N/A   Social History Main Topics  . Smoking status: Former Games developer  . Smokeless tobacco: None  . Alcohol Use: No  . Drug Use: No  . Sexual  Activity: Not Asked   Other Topics Concern  . None   Social History Narrative   Additional Social History:    Pain Medications: See PTA meds Prescriptions: See PTA meds Over the Counter: See PTA meds History of alcohol / drug use?: No history of alcohol / drug abuse                    Sleep: Fair  Appetite:  Fair  Current Medications: Current Facility-Administered Medications  Medication Dose Route Frequency Provider Last Rate Last Dose  . acetaminophen (TYLENOL) tablet 650 mg  650 mg Oral Q6H PRN Lavere Stork B Kym Fenter, MD      . aspirin chewable tablet 162 mg  162 mg Oral Daily Shari Prows, MD   162 mg at 01/23/16 0821  . lithium carbonate capsule 300 mg  300 mg Oral TID WC Datrell Dunton B Kanani Mowbray, MD   300 mg at 01/31/16 1700  . magnesium hydroxide (MILK OF MAGNESIA) suspension 30 mL  30 mL Oral Daily PRN Tekesha Almgren B Tambria Pfannenstiel, MD      . OLANZapine zydis (ZYPREXA) disintegrating tablet 20 mg  20 mg Oral QAC lunch Shari Prows, MD       Or  . OLANZapine (ZYPREXA) injection 10 mg  10 mg Intramuscular QAC lunch Krithi Bray B Brayon Bielefeld, MD   10 mg at 02/06/16 1212  . traZODone (DESYREL) tablet 150 mg  150 mg  Oral QHS PRN Shari Prows, MD   150 mg at 01/24/16 0012  . ziprasidone (GEODON) injection 20 mg  20 mg Intramuscular Q2H PRN Otis Burress B Allanah Mcfarland, MD        Lab Results: No results found for this or any previous visit (from the past 48 hour(s)).  Physical Findings: AIMS: Facial and Oral Movements Muscles of Facial Expression: None, normal Lips and Perioral Area: None, normal Jaw: None, normal Tongue: None, normal,Extremity Movements Upper (arms, wrists, hands, fingers): None, normal Lower (legs, knees, ankles, toes): None, normal, Trunk Movements Neck, shoulders, hips: None, normal, Overall Severity Severity of abnormal movements (highest score from questions above): None, normal Incapacitation due to abnormal movements: None, normal Patient's  awareness of abnormal movements (rate only patient's report): No Awareness, Dental Status Current problems with teeth and/or dentures?: No Does patient usually wear dentures?: No  CIWA:    COWS:     Musculoskeletal: Strength & Muscle Tone: within normal limits Gait & Station: normal Patient leans: N/A  Psychiatric Specialty Exam: Review of Systems  All other systems reviewed and are negative.   Blood pressure 115/74, pulse 74, temperature 98.2 F (36.8 C), temperature source Oral, resp. rate 18, height 5' 4.96" (1.65 m), weight 55.339 kg (122 lb), SpO2 100 %.Body mass index is 20.33 kg/(m^2).  General Appearance: Casual  Eye Contact::  Good  Speech:  Clear and Coherent  Volume:  Increased  Mood:  Euphoric  Affect:  Congruent  Thought Process:  Disorganized  Orientation:  Full (Time, Place, and Person)  Thought Content:  Delusions and Paranoid Ideation  Suicidal Thoughts:  No  Homicidal Thoughts:  No  Memory:  Immediate;   Fair Recent;   Fair Remote;   Fair  Judgement:  Fair  Insight:  Shallow  Psychomotor Activity:  Normal  Concentration:  Fair  Recall:  [employer] of Knowledge:Fair  Language: Fair  Akathisia:  No  Handed:  Right  AIMS (if indicated):     Assets:  Communication Skills Desire for Improvement Financial Resources/Insurance Physical Health Resilience Social Support  ADL's:  Intact  Cognition: WNL  Sleep:  Number of Hours: 7.5   Treatment Plan Summary: Daily contact with patient to assess and evaluate symptoms and progress in treatment and Medication management   Jennifer Leon is a 65 year old female with a history of psychosis most likely schizoaffective disorder admitted for disorganized, agitated and bizarre behavior.  1. Schizophrenia: As the patient has been refusing oral medications, she was started on Zyprexa injections per forced medication order. We renewed forced medication order. She was offered lithium orally but has been refusing the  medications. The patient continues to be paranoid and delusional and insight is poor. The patient has trazodone 50 mg by mouth nightly when necessary for insomnia.  2. Coronary artery disease: Vital signs are stable We continued ASA. She notoriously refuses.  4. Metabolic syndrome. Lipid panel, hemoglobin A1C, TSH and prolactin are normal.    5. Diet: She has double portions.   6. Disposition. TBD. She seems homeless. The [facility]. She needs a guardian.   Kristine Linea, MD 02/06/2016, 8:44 PM

## 2016-02-06 NOTE — Plan of Care (Signed)
Problem: Ineffective individual coping Goal: STG:Pt. will utilize relaxation techniques to reduce stress STG: Patient will utilize relaxation techniques to reduce stress levels  Outcome: Progressing Patient was seen playing games with other patients in the milieu

## 2016-02-06 NOTE — Tx Team (Signed)
Interdisciplinary Treatment Plan Update (Adult)  Date:  02/06/2016 Time Reviewed:  1:38 PM  Progress in Treatment: Attending groups: Intermittently Participating in groups:  Intermittently Taking medication as prescribed:  Yes. Patient on forced medications Tolerating medication:  Yes. Family/Significant othe contact made:  CSW spoke with the pt's fellow former church members from [place] Patient understands diagnosis:  No. Discussing patient identified problems/goals with staff:  Yes. Medical problems stabilized or resolved:  Yes. Denies suicidal/homicidal ideation: Yes. Issues/concerns per patient self-inventory:  No. Other:  New problem(s) identified: No, Describe:  none reported  Discharge Plan or Barriers:Patient will stabilize on medications and discharge home. Patient will follow up with her outpatient provider at discharge for medication management and therapy.   Reason for Continuation of Hospitalization: Homicidal ideation Mania  Comments: Patient on [facility] wait list. Not safe for discharge and is manic and agitated easily.  Patient is very disorganized.  Estimated length of stay: up to 7 days expected discharge Thursday 02/07/16  New goal(s):  Review of initial/current patient goals per problem list:   1.  Goal(s):participate in aftercare planning  Met:  No  Target date:by discharge  As evidenced ZO:[id number] participation in aftercare plan AEB aftercare provider and housing plan at discharge being identified  04/06/80: police went by patient home but no one answered. No confirmation on patient residence and if she can return. Patient not able to provide working number to relative or friend. Patient is currently on [facility] wait list as of Monday 01/28/16  01/31/16: patient still disorganized and not able to participate in discharge planning at this time.  02/06/16: Goal progressing.  2.  Goal (s):mania will become manageable  Met:  No  Target date:by discharge  As  evidenced [id number] demonstrates decreased signs and symptoms of mania 01/29/16: patient still manic, disorganized and labile, irritable with staff this morning wanting to discharge and thinks staff is ISIS. 01/31/16: CSW met with patient and MD to discuss discharge plan and patient became aggressive and agitated and not able to converse with staff as she was yelling and screaming.  2/8: Goal progressing.   Attendees: Physician:  Orson Slick, MD 2/8/201710:20 AM  Nursing:   Elige Radon, RN 2/8/201710:20 AM  Other:  Carmell Austria, [place] 2/8/201710:20 AM  Nursing: Polly Cobia, RN 2/8/201710:20 AM  Nursing: Carolynn Sayers 2/8/201710:20 AM  Other: 2/8/201710:20 AM  Other:  [id number] AM  Other:  2/8/201710:20 AM  Other:  2/8/201710:20 AM  Other:  2/8/201710:20 AM  Other:  2/8/201710:20 AM  Other:   2/8/201710:20 AM    Scribe for Treatment Team:   Claudine Mouton, MSW, LCSWA [phone]  02/06/2016, 1:38 PM

## 2016-02-06 NOTE — Plan of Care (Signed)
Problem: Alteration in thought process Goal: STG-Patient is able to discuss thoughts with staff Outcome: Not Progressing Limited insight into behavior

## 2016-02-06 NOTE — [facility] Group Notes (Signed)
[facility] LCSW Group Therapy  02/06/2016 3:35 PM  Type of Therapy:  Group Therapy  Participation Level:  Minimal  Participation Quality:  Attentive and Redirectable  Affect:  Anxious  Cognitive:  Alert and Disorganized  Insight:  Limited  Engagement in Therapy:  Limited  Modes of Intervention:  Socialization and Support  Summary of Progress/Problems: Patient attended and participated in group discussion appropriately introducing herself and sharing that her 'super power' is "my faith". Patient was calmer in this group than in the past. Patient was not able to stay focused on discussion and was off topic but redirectable which is an improvement for her.   Lulu Riding, MSW, LCSWA 02/06/2016, 3:35 PM

## 2016-02-07 NOTE — [facility] Group Notes (Signed)
[facility] LCSW Group Therapy  02/07/2016 4:21 PM  Type of Therapy:  Group Therapy  Participation Level:  Minimal  Participation Quality:  Attentive, Intrusive and Redirectable  Affect:  Anxious, Irritable and Labile  Cognitive:  Alert and Disorganized  Insight:  Limited  Engagement in Therapy:  Limited  Modes of Intervention:  Socialization and Support  Summary of Progress/Problems: Patient attended group and participated and was appropriate at times. Patient began group introducing herself and sharing that her dream vacation would be "here in the [place]". Patient offered one group member support and encouragement that was battling addiction but was innappropriate when another female group member opened up about loss of 2 family members recently. Patient was redirectable but struggled controlling her words and is probably more appropriate for a group with less processing.    Lulu Riding, MSW, LCSWA  02/07/2016, 4:21 PM

## 2016-02-07 NOTE — Progress Notes (Signed)
D:Continue to voice of her patient rights  Being violated . Patient doesn't argue as much as she has in past  Patient stated slept good last night .Stated appetite is good and energy level  Is normal. Stated concentration is good .  Denies suicidal  homicidal ideations  .  No auditory hallucinations  No pain concerns . Appropriate ADL'S. Interacting with peers and staff.  A: Encourage patient participation with unit programming . Instruction  Given on  Medication , verbalize understanding. R: Voice no other concerns. Staff continue to monitor

## 2016-02-07 NOTE — Progress Notes (Signed)
[facility] MD Progress Note  02/07/2016 12:19 PM Jennifer Leon  MRN:  [id number]  Subjective:  Jennifer Leon insisted on being discharged but she is still rather intrusive, paranoid, and disorganized. She was very inappropriate in group today. She still refuses medications and only takes Zyprexa injection with much encouragement. She gets upset after each injection. She did not sleep well last night at all but continues to refuse a sleeping aid. Social worker will again attempt to work with the patient on details of discharge. We know that her lease ended on February 7. The patient has not been able to reach her landlord. He initially thought that the patient needs a guardian. Even though she is not well she's progressing and it may not be necessary at this point.   Principal Problem: Schizoaffective disorder, bipolar type (HCC) Diagnosis:   Patient Active Problem List   Diagnosis Date Noted  . Schizoaffective disorder, bipolar type (HCC) [F25.0] 01/14/2016   Total Time spent with patient: 20 minutes  Past Psychiatric History: Bipolar disorder.   Past Medical History:  Past Medical History  Diagnosis Date  . Hypertension   . Anxiety   . GERD (gastroesophageal reflux disease)   . Noncompliance with medication regimen   . PTSD (post-traumatic stress disorder)   . Schizoaffective disorder (HCC)   . Psychosis   . Paranoid delusion [facility])     Past Surgical History  Procedure Laterality Date  . Eye surgery    . Abdominal surgery      blockage  . Bladder surgery     Family History: History reviewed. No pertinent family history. Family Psychiatric  History: Mother with mental illness.  Social History:  History  Alcohol Use No     History  Drug Use No    Social History   Social History  . Marital Status: Married    Spouse Name: N/A  . Number of Children: N/A  . Years of Education: N/A   Social History Main Topics  . Smoking status: Former Games developer  . Smokeless tobacco: None  .  Alcohol Use: No  . Drug Use: No  . Sexual Activity: Not Asked   Other Topics Concern  . None   Social History Narrative   Additional Social History:    Pain Medications: See PTA meds Prescriptions: See PTA meds Over the Counter: See PTA meds History of alcohol / drug use?: No history of alcohol / drug abuse                    Sleep: Poor  Appetite:  Good  Current Medications: Current Facility-Administered Medications  Medication Dose Route Frequency Provider Last Rate Last Dose  . acetaminophen (TYLENOL) tablet 650 mg  650 mg Oral Q6H PRN Jamori Biggar B Lachrisha Ziebarth, MD      . aspirin chewable tablet 162 mg  162 mg Oral Daily Shari Prows, MD   162 mg at 01/23/16 0821  . lithium carbonate capsule 300 mg  300 mg Oral TID WC Emalee Knies B Jocabed Cheese, MD   300 mg at 01/31/16 1700  . magnesium hydroxide (MILK OF MAGNESIA) suspension 30 mL  30 mL Oral Daily PRN Vivian Neuwirth B Keyonta Madrid, MD      . OLANZapine zydis (ZYPREXA) disintegrating tablet 20 mg  20 mg Oral QAC lunch Shari Prows, MD       Or  . OLANZapine (ZYPREXA) injection 10 mg  10 mg Intramuscular QAC lunch Trentyn Boisclair B Jim Lundin, MD   10 mg at 02/07/16 1200  .  traZODone (DESYREL) tablet 150 mg  150 mg Oral QHS PRN Shari Prows, MD   150 mg at 01/24/16 0012  . ziprasidone (GEODON) injection 20 mg  20 mg Intramuscular Q2H PRN Juliocesar Blasius B Hakan Nudelman, MD        Lab Results: No results found for this or any previous visit (from the past 48 hour(s)).  Physical Findings: AIMS: Facial and Oral Movements Muscles of Facial Expression: None, normal Lips and Perioral Area: None, normal Jaw: None, normal Tongue: None, normal,Extremity Movements Upper (arms, wrists, hands, fingers): None, normal Lower (legs, knees, ankles, toes): None, normal, Trunk Movements Neck, shoulders, hips: None, normal, Overall Severity Severity of abnormal movements (highest score from questions above): None, normal Incapacitation due to  abnormal movements: None, normal Patient's awareness of abnormal movements (rate only patient's report): No Awareness, Dental Status Current problems with teeth and/or dentures?: No Does patient usually wear dentures?: No  CIWA:    COWS:     Musculoskeletal: Strength & Muscle Tone: within normal limits Gait & Station: normal Patient leans: N/A  Psychiatric Specialty Exam: Review of Systems  All other systems reviewed and are negative.   Blood pressure 135/79, pulse 68, temperature 98.4 F (36.9 C), temperature source Oral, resp. rate 18, height 5' 4.96" (1.65 m), weight 55.339 kg (122 lb), SpO2 100 %.Body mass index is 20.33 kg/(m^2).  General Appearance: Casual  Eye Contact::  Good  Speech:  Pressured  Volume:  Increased  Mood:  Angry, Dysphoric and Irritable  Affect:  Congruent  Thought Process:  Disorganized  Orientation:  Full (Time, Place, and Person)  Thought Content:  Delusions and Paranoid Ideation  Suicidal Thoughts:  No  Homicidal Thoughts:  No  Memory:  Immediate;   Fair Recent;   Fair Remote;   Fair  Judgement:  Poor  Insight:  Lacking  Psychomotor Activity:  Normal  Concentration:  Fair  Recall:  [employer] of Knowledge:Fair  Language: Fair  Akathisia:  No  Handed:  Right  AIMS (if indicated):     Assets:  Communication Skills Desire for Improvement Financial Resources/Insurance Physical Health Resilience  ADL's:  Intact  Cognition: WNL  Sleep:  Number of Hours: 3.25   Treatment Plan Summary: Daily contact with patient to assess and evaluate symptoms and progress in treatment and Medication management   Jennifer Leon is a 65 year old female with a history of psychosis most likely schizoaffective disorder admitted for disorganized, agitated and bizarre behavior.  1. Schizophrenia: As the patient has been refusing oral medications, she was started on Zyprexa injections per forced medication order. We renewed forced medication order. She was offered  lithium orally but has been refusing the medications. The patient continues to be paranoid and delusional and insight is poor. The patient has trazodone 50 mg by mouth nightly when necessary for insomnia.  2. Coronary artery disease: Vital signs are stable We continued ASA. She notoriously refuses.  4. Metabolic syndrome. Lipid panel, hemoglobin A1C, TSH and prolactin are normal.    5. Diet: She has double portions.   6. Disposition. TBD. [facility]. She has not been able to assist her SW in discharge planning.   Kristine Linea, MD 02/07/2016, 12:19 PM

## 2016-02-07 NOTE — Progress Notes (Signed)
Recreation Therapy Notes  Date: 02.09.17 Time: 3:00 pm Location: Craft Room  Group Topic: Leisure Education  Goal Area(s) Addresses:  Patient will identify activities for each letter of the alphabet. Patient will verbalize ability to integrate positive leisure into life post d/c. Patient will verbalize ability to use leisure as a Associate Professor.  Behavioral Response: Attentive, Interactive  Intervention: Leisure Alphabet  Activity: Patients were given a Leisure Information systems manager and instructed to write healthy leisure activities for each letter of the alphabet.  Education: LRT educated patients on what they need to participate in leisure.  Education Outcome: In group clarification offered  Clinical Observations/Feedback: Patient was upset at the beginning of group for having to wash her hands. Patient continued to talk about it. LRT informed patient if she could not stop talking about it, she would need to go back to her room. Patient said she would stay quiet. Patient completed activity by writing healthy leisure activities. Patient contributed to group discussion by stating healthy leisure activities.   Jacquelynn Cree, LRT/CTRS 02/07/2016 4:05 PM

## 2016-02-07 NOTE — Plan of Care (Signed)
Problem: Alteration in thought process Goal: LTG-Patient behavior demonstrates decreased signs psychosis (Patient behavior demonstrates decreased signs of psychosis to the point the patient is safe to return home and continue treatment in an outpatient setting.)  Outcome: Progressing Patient responding  In polite manner to staff

## 2016-02-08 NOTE — Progress Notes (Signed)
D: Patient did not ask any questions to the staff this evening. She kept to the day room and played games with other patients. She denies SI/HI/AVH. She denied pain.  A: Patient had no HS meds to give. Encouragement was provided.  R: Patient was calm and cooperative throughout the evening. Safety maintained with 15 min checks.

## 2016-02-08 NOTE — Progress Notes (Signed)
[facility] MD Progress Note  02/08/2016 10:37 AM Jennifer Leon  MRN:  [id number]  Subjective:  Jennifer Leon is a 65 year old female with history of bipolar disorder admitted in a severely manic, psychotic episode. She continues to refuse oral medications but accepts Zyprexa injections with encouragement.  Jennifer Leon is again very irritable, loud, argumentative, disruptive today. She again slept only 3 hours but refuses sleeping aids. She is more cooperative with the social worker when it comes to discharge planning. They were able to call the bank yesterday to confirm that she has money in her account which she thought were stolen. We are still unable to get in touch with her landlord. Apparently her lease ended this month.   Principal Problem: Schizoaffective disorder, bipolar type (HCC) Diagnosis:   Patient Active Problem List   Diagnosis Date Noted  . Schizoaffective disorder, bipolar type (HCC) [F25.0] 01/14/2016   Total Time spent with patient: 20 minutes  Past Psychiatric History: Bipolar disorder.  Past Medical History:  Past Medical History  Diagnosis Date  . Hypertension   . Anxiety   . GERD (gastroesophageal reflux disease)   . Noncompliance with medication regimen   . PTSD (post-traumatic stress disorder)   . Schizoaffective disorder (HCC)   . Psychosis   . Paranoid delusion [facility])     Past Surgical History  Procedure Laterality Date  . Eye surgery    . Abdominal surgery      blockage  . Bladder surgery     Family History: History reviewed. No pertinent family history. Family Psychiatric  History: Bipolar disorder. Social History:  History  Alcohol Use No     History  Drug Use No    Social History   Social History  . Marital Status: Married    Spouse Name: N/A  . Number of Children: N/A  . Years of Education: N/A   Social History Main Topics  . Smoking status: Former Games developer  . Smokeless tobacco: None  . Alcohol Use: No  . Drug Use: No  . Sexual Activity:  Not Asked   Other Topics Concern  . None   Social History Narrative   Additional Social History:    Pain Medications: See PTA meds Prescriptions: See PTA meds Over the Counter: See PTA meds History of alcohol / drug use?: No history of alcohol / drug abuse                    Sleep: Poor  Appetite:  Fair  Current Medications: Current Facility-Administered Medications  Medication Dose Route Frequency Provider Last Rate Last Dose  . acetaminophen (TYLENOL) tablet 650 mg  650 mg Oral Q6H PRN Verena Shawgo B Jakori Burkett, MD      . aspirin chewable tablet 162 mg  162 mg Oral Daily Shari Prows, MD   162 mg at 01/23/16 0821  . lithium carbonate capsule 300 mg  300 mg Oral TID WC Abryana Lykens B Saif Peter, MD   300 mg at 01/31/16 1700  . magnesium hydroxide (MILK OF MAGNESIA) suspension 30 mL  30 mL Oral Daily PRN Esti Demello B Mescal Flinchbaugh, MD      . OLANZapine zydis (ZYPREXA) disintegrating tablet 20 mg  20 mg Oral QAC lunch Shari Prows, MD       Or  . OLANZapine (ZYPREXA) injection 10 mg  10 mg Intramuscular QAC lunch Enya Bureau B Shirla Hodgkiss, MD   10 mg at 02/07/16 1200  . traZODone (DESYREL) tablet 150 mg  150 mg Oral QHS PRN Kylo Gavin B  Dena Esperanza, MD   150 mg at 01/24/16 0012  . ziprasidone (GEODON) injection 20 mg  20 mg Intramuscular Q2H PRN Bijal Siglin B Jaeley Wiker, MD        Lab Results: No results found for this or any previous visit (from the past 48 hour(s)).  Physical Findings: AIMS: Facial and Oral Movements Muscles of Facial Expression: None, normal Lips and Perioral Area: None, normal Jaw: None, normal Tongue: None, normal,Extremity Movements Upper (arms, wrists, hands, fingers): None, normal Lower (legs, knees, ankles, toes): None, normal, Trunk Movements Neck, shoulders, hips: None, normal, Overall Severity Severity of abnormal movements (highest score from questions above): None, normal Incapacitation due to abnormal movements: None, normal Patient's awareness  of abnormal movements (rate only patient's report): No Awareness, Dental Status Current problems with teeth and/or dentures?: No Does patient usually wear dentures?: No  CIWA:    COWS:     Musculoskeletal: Strength & Muscle Tone: within normal limits Gait & Station: normal Patient leans: N/A  Psychiatric Specialty Exam: Review of Systems  All other systems reviewed and are negative.   Blood pressure 118/75, pulse 64, temperature 98.1 F (36.7 C), temperature source Oral, resp. rate 18, height 5' 4.96" (1.65 m), weight 55.339 kg (122 lb), SpO2 100 %.Body mass index is 20.33 kg/(m^2).  General Appearance: Casual  Eye Contact::  Good  Speech:  Pressured  Volume:  Increased  Mood:  Angry, Dysphoric and Irritable  Affect:  Inappropriate and Labile  Thought Process:  Goal Directed  Orientation:  Full (Time, Place, and Person)  Thought Content:  Delusions and Paranoid Ideation  Suicidal Thoughts:  No  Homicidal Thoughts:  No  Memory:  Immediate;   Fair Recent;   Fair Remote;   Fair  Judgement:  Poor  Insight:  Lacking  Psychomotor Activity:  Normal  Concentration:  Fair  Recall:  [employer] of Knowledge:Fair  Language: Fair  Akathisia:  No  Handed:  Right  AIMS (if indicated):     Assets:  Communication Skills  ADL's:  Intact  Cognition: WNL  Sleep:  Number of Hours: 3.75   Treatment Plan Summary: Daily contact with patient to assess and evaluate symptoms and progress in treatment and Medication management   Jennifer Leon is a 65 year old female with a history of bipolar disorder admitted for disorganized, paranoid, agitated and bizarre behavior.  1. Mood/psychosis. The patient has been consistently refusing oral medications. She has been given daily Zyprexa injections initially by forced medication order lately by consent.   2. Coronary artery disease. Vital signs are stable We continued ASA. She notoriously refuses.  4. Metabolic syndrome. Lipid panel, hemoglobin A1C,  TSH and prolactin are normal.    5. Diet. The She has double portions.   6. Disposition. TBD. [facility] She has not been able to assist her SW in discharge planning.    Kristine Linea, MD 02/08/2016, 10:37 AM

## 2016-02-08 NOTE — Progress Notes (Signed)
Patient ID: Jennifer Leon, female   DOB: [birth year], 65 y.o.   MRN: [id number] CSW spoke to the pt's landlord at ph: [phone] and [phone].  Landlord stated the pt gave him notice she was moving in December and paid until January 2nd.  Landlord disclosed he cleaned out the pt's house and stored some basic personal items and still has her furniture if she wants it, but the majority of her belongings are gone.  Landlord stated he would be willing,if the CSW were to mediate, the pt moving back into that former residence of the pt, or to a newly renovated apartment owned by the landlord in [place] that is on the bus line and will be more inexpensive for the pt as a result.  Landlord stated the pt spent exorbitant amounts of money on taxis.  Landlord stated the new apartment can be furnished at the owner's expense or empty depending upon the pt's wishes.  Landlord stated the pt had a third bedroom set up in her storage shed and did not know why the pt was living in her storage shed, instead of her home, if that was indeed the case.  Landlord stated that when the pt was on her meds she was a very nice person and would have her tithe money for church ($140 a month) in her pocket even if she were homeless and sleeping in below-freezing temperatures because she didn't want to spend "God's money, it isnt right".    CSW also spoke to a couple in [place] who also lived in [place] who reiterated that the pt was very nice while on meds but was verbally abusive when she was not on her meds. [name] at ph: [phone] stated that the pt's husband was sent by the pt on a train back to [place] and the pt's husband got off the train, fell and was injured and sent, as a veteran with dementia to a home  (she thinks it was [facility] at ph: [phone]) and is there now.  It is in [place].  The new apartment is at [address] [place] in [place] and it is available to view on  [email or web address].  The home is newly renovated and is very well appointed.  Landlord wants to be assure the pt will not return angry that most of her bvelongings are gone.  Landlord and [name] were adamant that the pt DOES NOT get their personal numbers.  Landlord asked that the CSW mediate the pt returning to his apartment(s).

## 2016-02-08 NOTE — Progress Notes (Addendum)
D: Patient affect is anxious and her mood is labile.  Patient remains suspicious and paranoid and is focused on discharge.  Patient states, "I'm being held against my will.  This is illegal.  I don't need to be here."  Patient visible on the milieu. A: Support and encouragement offered. Patient refuses scheduled medications.   Q 15 min checks continued for patient safety. R: Patient unreceptive. Patient remains safe on the unit.

## 2016-02-08 NOTE — [facility] Group Notes (Signed)
[facility] LCSW Group Therapy  02/08/2016 4:29 PM  Type of Therapy:  Group Therapy  Participation Level:  None  Participation Quality:  Attentive  Affect:  Anxious  Cognitive:  Alert and Disorganized  Insight:  Limited  Engagement in Therapy:  Limited  Modes of Intervention:  Socialization and Support  Summary of Progress/Problems: Patient attended group but refused to share stating that "I always get in trouble when I open my mouth".  Patient was attentive throughout group but appeared to have something to say but did not speak.  Lulu Riding, MSW, LCSWA 02/08/2016, 4:29 PM

## 2016-02-08 NOTE — [facility] Group Notes (Signed)
[facility] LCSW After[facility] Discharge Planning Group Note   02/08/2016 4:07 PM  Participation Quality:  Patient participated in group discussion and shared her SMART goal and introduced herself to the group. Patient was disorganized but was able to control her reactions better today in group.   Mood/Affect:  Excited  Depression Rating:  10  Anxiety Rating:  10  Thoughts of Suicide:  No Will you contract for safety?   NA  Current AVH:  Negative  Plan for Discharge/Comments:  [facility] waitlist but has not place to go at the moment but has income  Transportation Means: will need assistance at discharge  Lulu Riding, MSW, [employer]

## 2016-02-08 NOTE — Plan of Care (Signed)
Problem: Ineffective individual coping Goal: STG:Pt. will utilize relaxation techniques to reduce stress STG: Patient will utilize relaxation techniques to reduce stress levels  Outcome: Progressing Patient is visible in the milieu playing games with other patients

## 2016-02-08 NOTE — [facility] Group Notes (Signed)
[facility] Group Notes:  (Nursing/MHT/Case Management/Adjunct)  Date:  02/08/2016  Time:  12:26 PM  Type of Therapy:  Group Therapy  Participation Level:  Active  Participation Quality:  Attentive  Affect:  Appropriate  Cognitive:  Alert, Appropriate and Oriented  Insight:  Appropriate  Engagement in Group:  Engaged  Modes of Intervention:  Activity  Summary of Progress/Problems:  Jennifer Leon 02/08/2016, 12:26 PM

## 2016-02-09 NOTE — [facility] Group Notes (Signed)
[facility] LCSW Group Therapy  02/09/2016 12:11 PM  Type of Therapy:  Group Therapy  Participation Level:  Minimal  Participation Quality:  Attentive  Affect:  Flat  Cognitive:  Alert  Insight:  Limited  Engagement in Therapy:  Limited  Modes of Intervention:  Discussion, Education, Socialization and Support  Summary of Progress/Problems: Balance in life: Patients will discuss the concept of balance and how it looks and feels to be unbalanced. Pt will identify areas in their life that is unbalanced and ways to become more balanced. Pt attended group and stayed the entire time. She sat quietly and listened to other group members share.   [employer] MSW, LCSWA  02/09/2016, 12:11 PM

## 2016-02-09 NOTE — Plan of Care (Signed)
Problem: Alteration in thought process Goal: LTG-Patient verbalizes understanding importance med regimen (Patient verbalizes understanding of importance of medication regimen and need to continue outpatient care.)  Outcome: Not Progressing Patient is resistant to medication regimen and will not cooperative with medication administration currently

## 2016-02-09 NOTE — Progress Notes (Signed)
D:  Patient is alert and oriented on the unit this shift.  Patient attended and actively participated in groups today and was only mildly disruptive.  Patient denies suicidal ideation, homicidal ideation, auditory or visual hallucinations at the present time.   A:  Scheduled po medications are refused by patient.  Scheduled IM medication is administered to patient as per MD orders.  Emotional support and encouragement are provided.  Patient is maintained on q.15 minute safety checks.  Patient is informed to notify staff with questions or concerns. R:  No adverse medication reactions are noted.  Patient is cooperative with IM medication administration and treatment plan today.  Patient remains safe at this time.

## 2016-02-09 NOTE — Plan of Care (Signed)
Problem: Ineffective individual coping Goal: STG: Patient will remain free from self harm Outcome: Progressing Patient remains free from self harm currently     

## 2016-02-09 NOTE — Progress Notes (Signed)
[facility] MD Progress Note  02/09/2016 3:46 PM Jennifer Leon  MRN:  [id number]  Subjective:  Jennifer Leon is a 65 year old female with history of bipolar disorder admitted in a severely manic, psychotic episode. She continues to be manic during the interview patient. Patient reported that she has been committed and it is against her constitutional rights. She waslooking at her Bible and reported that she is a Jennifer Leon and it is against her constitutional and fundamental rights that she stays in the hospital. She does not want to accept any medications as she was off the medications for the past 6 years and was not having any symptoms. She reported that she does not want to take any medications. She reported that they are injecting  her medications on a daily basis which makes her sleep and it is not right for her to sleep with the help of the medications. She reported that she is going to call the lawyer as it is not right for her to take the medications by injections. It was difficult to redirect the patient during the interview. She continues to refuse oral medications but accepts Zyprexa injections with encouragement.   Principal Problem: Schizoaffective disorder, bipolar type (HCC) Diagnosis:   Patient Active Problem List   Diagnosis Date Noted  . Schizoaffective disorder, bipolar type (HCC) [F25.0] 01/14/2016   Total Time spent with patient: 20 minutes  Past Psychiatric History: Bipolar disorder.  Past Medical History:  Past Medical History  Diagnosis Date  . Hypertension   . Anxiety   . GERD (gastroesophageal reflux disease)   . Noncompliance with medication regimen   . PTSD (post-traumatic stress disorder)   . Schizoaffective disorder (HCC)   . Psychosis   . Paranoid delusion Jennifer Leon)     Past Surgical History  Procedure Laterality Date  . Eye surgery    . Abdominal surgery      blockage  . Bladder surgery     Family History: History reviewed. No pertinent family history. Family  Psychiatric  History: Bipolar disorder. Social History:  History  Alcohol Use No     History  Drug Use No    Social History   Social History  . Marital Status: Married    Spouse Name: N/A  . Number of Children: N/A  . Years of Education: N/A   Social History Main Topics  . Smoking status: Former Games developer  . Smokeless tobacco: None  . Alcohol Use: No  . Drug Use: No  . Sexual Activity: Not Asked   Other Topics Concern  . None   Social History Narrative   Additional Social History:    Pain Medications: See PTA meds Prescriptions: See PTA meds Over the Counter: See PTA meds History of alcohol / drug use?: No history of alcohol / drug abuse                    Sleep: Poor  Appetite:  Fair  Current Medications: Current Facility-Administered Medications  Medication Dose Route Frequency Provider Last Rate Last Dose  . acetaminophen (TYLENOL) tablet 650 mg  650 mg Oral Q6H PRN Jolanta B Pucilowska, MD      . aspirin chewable tablet 162 mg  162 mg Oral Daily Shari Prows, MD   162 mg at 01/23/16 0821  . lithium carbonate capsule 300 mg  300 mg Oral TID WC Jolanta B Pucilowska, MD   300 mg at 01/31/16 1700  . magnesium hydroxide (MILK OF MAGNESIA) suspension 30 mL  30  mL Oral Daily PRN Jolanta B Pucilowska, MD      . OLANZapine zydis (ZYPREXA) disintegrating tablet 20 mg  20 mg Oral QAC lunch Shari Prows, MD       Or  . OLANZapine (ZYPREXA) injection 10 mg  10 mg Intramuscular QAC lunch Jolanta B Pucilowska, MD   10 mg at 02/09/16 1228  . traZODone (DESYREL) tablet 150 mg  150 mg Oral QHS PRN Shari Prows, MD   150 mg at 01/24/16 0012  . ziprasidone (GEODON) injection 20 mg  20 mg Intramuscular Q2H PRN Jolanta B Pucilowska, MD        Lab Results: No results found for this or any previous visit (from the past 48 hour(s)).  Physical Findings: AIMS: Facial and Oral Movements Muscles of Facial Expression: None, normal Lips and Perioral Area:  None, normal Jaw: None, normal Tongue: None, normal,Extremity Movements Upper (arms, wrists, hands, fingers): None, normal Lower (legs, knees, ankles, toes): None, normal, Trunk Movements Neck, shoulders, hips: None, normal, Overall Severity Severity of abnormal movements (highest score from questions above): None, normal Incapacitation due to abnormal movements: None, normal Patient's awareness of abnormal movements (rate only patient's report): No Awareness, Dental Status Current problems with teeth and/or dentures?: No Does patient usually wear dentures?: No  CIWA:    COWS:     Musculoskeletal: Strength & Muscle Tone: within normal limits Gait & Station: normal Patient leans: N/A  Psychiatric Specialty Exam: Review of Systems  Psychiatric/Behavioral: Positive for hallucinations. The patient is nervous/anxious and has insomnia.   All other systems reviewed and are negative.   Blood pressure 144/76, pulse 63, temperature 97.9 F (36.6 C), temperature source Oral, resp. rate 18, height 5' 4.96" (1.65 m), weight 122 lb (55.339 kg), SpO2 100 %.Body mass index is 20.33 kg/(m^2).  General Appearance: Casual  Eye Contact::  Good  Speech:  Pressured  Volume:  Increased  Mood:  Angry, Dysphoric and Irritable  Affect:  Inappropriate and Labile  Thought Process:  Circumstantial and Loose  Orientation:  Full (Time, Place, and Person)  Thought Content:  Delusions and Paranoid Ideation  Suicidal Thoughts:  No  Homicidal Thoughts:  No  Memory:  Immediate;   Fair Recent;   Fair Remote;   Fair  Judgement:  Poor  Insight:  Lacking  Psychomotor Activity:  Normal  Concentration:  Fair  Recall:  [employer] of Knowledge:Fair  Language: Fair  Akathisia:  No  Handed:  Right  AIMS (if indicated):     Assets:  Communication Skills  ADL's:  Intact  Cognition: WNL  Sleep:  Number of Hours: 5.3   Treatment Plan Summary: Daily contact with patient to assess and evaluate symptoms and  progress in treatment and Medication management   Jennifer Leon is a 65 year old female with a history of bipolar disorder admitted for disorganized, paranoid, agitated and bizarre behavior.  1. Mood/psychosis. The patient has been consistently refusing oral medications. She has been given daily Zyprexa injections initially by forced medication order lately by consent.   2. Coronary artery disease. Vital signs are stable We continued ASA. She notoriously refuses.  4. Metabolic syndrome. Lipid panel, hemoglobin A1C, TSH and prolactin are normal.    5. Diet. The She has double portions.   6. Disposition. TBD. [facility] She has not been able to assist her SW in discharge planning.    Brandy Hale, MD 02/09/2016, 3:46 PM

## 2016-02-09 NOTE — Progress Notes (Signed)
D: Pt isolated to room majority of shift. Refused interaction with RN. Voiced no concerns. A: Support provided. Medications given as prescribed. Q15 minute checks maintained for safety. R: Non receptive to interventions. Isolated to room. Refused interaction. Will continue to monitor.

## 2016-02-09 NOTE — [facility] Group Notes (Signed)
[facility] Group Notes:  (Nursing/MHT/Case Management/Adjunct)  Date:  02/09/2016  Time:  10:02 AM  Type of Therapy:  Psychoeducational Skills  Participation Level:  Active  Participation Quality:  Appropriate  Affect:  Appropriate  Cognitive:  Appropriate  Insight:  Appropriate  Engagement in Group:  Engaged  Modes of Intervention:  Education  Summary of Progress/Problems:  Jennifer Leon Jennifer Leon 02/09/2016, 10:02 AM 

## 2016-02-10 NOTE — Progress Notes (Signed)
[facility] MD Progress Note  02/10/2016 6:16 PM Jennifer Leon  MRN:  [id number]  Subjective:  Jennifer Leon is a 65 year old female with history of bipolar disorder admitted in a severely manic, psychotic episode. She continues to be manic during the interview. She reported that she wants to clear the FDA facts as her power has been taken away when she is getting the IM medications. She was loud and agitated throughout the interview. She reported that she does not want to continue taking the medication as it is against her abuse of power. She also has several papers spread out in her bed as she reported that she is trying to contact the lawyer and will talk to them about her rights. It was difficult to have a meaningful conversation with the patient. It was also noted that she is having an argument with another patient on the floor and they were having some difficulty.  She is not exhibiting any suicidal homicidal ideations or plans at this time however she has very poor insight about her labs and is not accepting her medications  Principal Problem: Schizoaffective disorder, bipolar type (HCC) Diagnosis:   Patient Active Problem List   Diagnosis Date Noted  . Schizoaffective disorder, bipolar type (HCC) [F25.0] 01/14/2016   Total Time spent with patient: 20 minutes  Past Psychiatric History: Bipolar disorder.  Past Medical History:  Past Medical History  Diagnosis Date  . Hypertension   . Anxiety   . GERD (gastroesophageal reflux disease)   . Noncompliance with medication regimen   . PTSD (post-traumatic stress disorder)   . Schizoaffective disorder (HCC)   . Psychosis   . Paranoid delusion [facility])     Past Surgical History  Procedure Laterality Date  . Eye surgery    . Abdominal surgery      blockage  . Bladder surgery     Family History: History reviewed. No pertinent family history. Family Psychiatric  History: Bipolar disorder. Social History:  History  Alcohol Use No     History   Drug Use No    Social History   Social History  . Marital Status: Married    Spouse Name: N/A  . Number of Children: N/A  . Years of Education: N/A   Social History Main Topics  . Smoking status: Former Games developer  . Smokeless tobacco: None  . Alcohol Use: No  . Drug Use: No  . Sexual Activity: Not Asked   Other Topics Concern  . None   Social History Narrative   Additional Social History:    Pain Medications: See PTA meds Prescriptions: See PTA meds Over the Counter: See PTA meds History of alcohol / drug use?: No history of alcohol / drug abuse                    Sleep: Poor  Appetite:  Fair  Current Medications: Current Facility-Administered Medications  Medication Dose Route Frequency Provider Last Rate Last Dose  . acetaminophen (TYLENOL) tablet 650 mg  650 mg Oral Q6H PRN Jolanta B Pucilowska, MD      . aspirin chewable tablet 162 mg  162 mg Oral Daily Shari Prows, MD   162 mg at 01/23/16 0821  . lithium carbonate capsule 300 mg  300 mg Oral TID WC Jolanta B Pucilowska, MD   300 mg at 01/31/16 1700  . magnesium hydroxide (MILK OF MAGNESIA) suspension 30 mL  30 mL Oral Daily PRN Shari Prows, MD      .  OLANZapine zydis (ZYPREXA) disintegrating tablet 20 mg  20 mg Oral QAC lunch Shari Prows, MD       Or  . OLANZapine (ZYPREXA) injection 10 mg  10 mg Intramuscular QAC lunch Jolanta B Pucilowska, MD   10 mg at 02/10/16 1310  . traZODone (DESYREL) tablet 150 mg  150 mg Oral QHS PRN Shari Prows, MD   150 mg at 01/24/16 0012  . ziprasidone (GEODON) injection 20 mg  20 mg Intramuscular Q2H PRN Jolanta B Pucilowska, MD        Lab Results: No results found for this or any previous visit (from the past 48 hour(s)).  Physical Findings: AIMS: Facial and Oral Movements Muscles of Facial Expression: None, normal Lips and Perioral Area: None, normal Jaw: None, normal Tongue: None, normal,Extremity Movements Upper (arms, wrists,  hands, fingers): None, normal Lower (legs, knees, ankles, toes): None, normal, Trunk Movements Neck, shoulders, hips: None, normal, Overall Severity Severity of abnormal movements (highest score from questions above): None, normal Incapacitation due to abnormal movements: None, normal Patient's awareness of abnormal movements (rate only patient's report): No Awareness, Dental Status Current problems with teeth and/or dentures?: No Does patient usually wear dentures?: No  CIWA:    COWS:     Musculoskeletal: Strength & Muscle Tone: within normal limits Gait & Station: normal Patient leans: N/A  Psychiatric Specialty Exam: Review of Systems  Psychiatric/Behavioral: Positive for hallucinations. The patient is nervous/anxious and has insomnia.   All other systems reviewed and are negative.     .                  Blood pressure 102/71, pulse 71, temperature 98.4 F (36.9 C), temperature source Oral, resp. rate 18, height 5' 4.96" (1.65 m), weight 122 lb (55.339 kg), SpO2 100 %.Body mass index is 20.33 kg/(m^2).  General Appearance: Casual  Eye Contact::  Good  Speech:  Pressured  Volume:  Increased  Mood:  Angry, Dysphoric and Irritable  Affect:  Inappropriate and Labile  Thought Process:  Circumstantial and Loose  Orientation:  Full (Time, Place, and Person)  Thought Content:  Delusions and Paranoid Ideation  Suicidal Thoughts:  No  Homicidal Thoughts:  No  Memory:  Immediate;   Fair Recent;   Fair Remote;   Fair  Judgement:  Poor  Insight:  Lacking  Psychomotor Activity:  Normal  Concentration:  Fair  Recall:  [employer] of Knowledge:Fair  Language: Fair  Akathisia:  No  Handed:  Right  AIMS (if indicated):     Assets:  Communication Skills  ADL's:  Intact  Cognition: WNL  Sleep:  Number of Hours: 4.45   Treatment Plan Summary: Daily contact with patient to assess and evaluate symptoms and progress in treatment and Medication management   Jennifer Leon is  a 65 year old female with a history of bipolar disorder admitted for disorganized, paranoid, agitated and bizarre behavior.  1. Mood/psychosis. The patient has been consistently refusing oral medications. She has been given daily Zyprexa injections initially by forced medication order lately by consent.   2. Coronary artery disease. Vital signs are stable We continued ASA. She notoriously refuses.  4. Metabolic syndrome. Lipid panel, hemoglobin A1C, TSH and prolactin are normal.    5. Diet. The She has double portions.   6. Disposition. TBD. [facility]. She has not been able to assist her SW in discharge planning.    Brandy Hale, MD 02/10/2016, 6:16  PM

## 2016-02-10 NOTE — Progress Notes (Signed)
D: Patient is alert and oriented x4. Patient attended and actively participated in groups today. Patient denies suicidal ideation, homicidal ideation, auditory or visual hallucinations at the present time.She states that "I am being held against my will and that doctor is making me take a medication that causes me harm".  Patient also stated that she felt "The Zyprexa is the cause of the Zika virus".    A: Scheduled po medications are refused by patient. Scheduled IM medication is administered to patient as per MD orders with second opinion. Emotional support and encouragement are provided. Patient is maintained on q.15 minute safety checks. Patient was informed to notify staff with questions or concerns. R: No adverse medication reactions are noted. Patient was cooperative with IM medication administration and treatment plan today. Patient remains safe at this time.

## 2016-02-10 NOTE — Progress Notes (Signed)
D: Patient was very agitated tonight and raised her voice on the phone. She's been verbally aggressive to staff asking if they were robots. She has also been disruptive in the milieu raising her voice. She denies SI/HI/AVH. She denied pain.  A: Patient had no HS meds to give. Encouragement was provided.  R: Safety maintained with 15 min checks. She has not been receptive to staff.

## 2016-02-10 NOTE — [facility] Group Notes (Signed)
[facility] LCSW Group Therapy  02/10/2016 3:41 PM  Type of Therapy:  Group Therapy  Participation Level:  Minimal  Participation Quality:  Attentive  Affect:  Appropriate  Cognitive:  Alert  Insight:  Limited   Engagement in Therapy:  Limited  Modes of Intervention:  Discussion, Education, Socialization and Support  Summary of Progress/Problems: Todays topic: Grudges  Patients will be encouraged to discuss their thoughts, feelings, and behaviors as to why one holds on to grudges and reasons why people have grudges. Patients will process the impact of grudges on their daily lives and identify thoughts and feelings related to holding grudges. Patients will identify feelings and thoughts related to what life would look like without grudges. Pt attended group and stayed the entire time. She sat quietly and listened to other group members.   Daisy Floro Joseantonio Dittmar MSW, LCSWA  02/10/2016, 3:41 PM

## 2016-02-10 NOTE — Progress Notes (Signed)
D: Pt was cooperative and noted to be less labile this evening. Pt was able to disagree with staff and refrain from yelling and slamming doors. Seen socializing in milieu. Denies SI/AVH. Denies pain. Voiced no additional concerns. A: Q15 checks maintained for safety. Encouragement and support provided. R: Pt remains safe on unit. No insight to why she is hospitalized. Cooperative. Will continue to monitor.

## 2016-02-11 MED ORDER — TRAZODONE HCL 150 MG PO TABS: 150.0000 mg | ORAL_TABLET | Freq: Every evening | ORAL | Status: AC | PRN

## 2016-02-11 MED ORDER — OLANZAPINE 20 MG PO TBDP: 20.0000 mg | ORAL_TABLET | Freq: Every day | ORAL | Status: AC

## 2016-02-11 NOTE — [facility] Group Notes (Signed)
[facility] LCSW Group Therapy  02/11/2016 2:36 PM  Type of Therapy:  Group Therapy  Participation Level:  Active  Participation Quality:  Appropriate and Attentive  Affect:  Anxious  Cognitive:  Alert, Appropriate and Oriented  Insight:  Engaged  Engagement in Therapy:  Engaged  Modes of Intervention:  Discussion, Socialization and Support  Summary of Progress/Problems: Patient attended and participated in group discussion appropriately introducing herself and sharing during an introductory exercise that her hobby she would like to get involved with is "getting a fish tank". Patient participated in group discussion and was much improved today with some insight to her situation agrees that she needs medication but is concerned about how this will affect her physically. Patient is excited to have a discharge plan coming together.  Lulu Riding, MSW, LCSWA 02/11/2016, 2:36 PM

## 2016-02-11 NOTE — [facility] Group Notes (Signed)
[facility] LCSW Aftercare Discharge Planning Group Note   02/11/2016 11:03 AM  Participation Quality:  Patient attended group and participated introducing herself and sharing her SMART goal is to "get discharged..., I am not going to hurt anybody. I just want to be understood." Patient feels that she is stable enough to discharge but lacks insight as patient is not currently aware of her housing issue due to lack of insight. Patient is homeless and has no family support identified at this time.    Mood/Affect:  Labile  Depression Rating:  0  Anxiety Rating:  2  Thoughts of Suicide:  No Will you contract for safety?   NA  Current AVH:  No  Plan for Discharge/Comments:  On [facility] wait list and may need assisted living placement. Patient could discharge if she had family to support and a place to go.   Transportation Means: Patient will need assistance with transportation at discharge  Supports: Patient has no support identified at this time but is married but patient does not know how to contact her husband and he may be placed in a nursing home.  Lulu Riding, MSW, LCSWA

## 2016-02-11 NOTE — [facility] Group Notes (Signed)
[facility] Group Notes:  (Nursing/MHT/Case Management/Adjunct)  Date:  02/11/2016  Time:  12:06 PM  Type of Therapy:  Psychoeducational Skills  Participation Level:  Active  Participation Quality:  Appropriate and Attentive  Affect:  Appropriate  Cognitive:  Alert and Appropriate  Insight:  Appropriate and Good  Engagement in Group:  Engaged  Modes of Intervention:  Discussion, Education and Support  Summary of Progress/Problems:  Jennifer Leon 02/11/2016, 12:06 PM

## 2016-02-11 NOTE — Plan of Care (Signed)
Problem: Ineffective individual coping Goal: STG: Patient will remain free from self harm Outcome: Progressing Patient remains free from self harm currently     

## 2016-02-11 NOTE — Plan of Care (Signed)
Problem: Ineffective individual coping Goal: STG: Patient will remain free from self harm Outcome: Progressing Pt safe on the unit at this time     

## 2016-02-11 NOTE — Progress Notes (Signed)
Recreation Therapy Notes  Date: 02.13.17 Time: 3:00 pm Location: Craft Room  Group Topic: Self-expression  Goal Area(s) Addresses:  Patient will identify one color per emotion listed on wheel. Patient will verbalize benefit of use art as a means of self-expression. Patient will verbalize one emotion experienced during session.  Behavioral Response: Did not attend  Intervention: Emotion Wheel  Activity: Patients were given an Emotion Wheel worksheet with 7 different emotions and instructed to pick a color for each emotion.  Education: LRT educated group on different forms of self-expression.  Education Outcome: Patient did not attend group.  Clinical Observations/Feedback: Patient walked in and out of group a couple times at the beginning and ended up leaving and not returning to group.  Jacquelynn Cree, LRT/CTRS 02/11/2016 4:27 PM

## 2016-02-11 NOTE — Tx Team (Signed)
Interdisciplinary Treatment Plan Update (Adult)  Date:  02/11/2016 Time Reviewed:  5:52 PM  Progress in Treatment: Attending groups: IYes Participating in groups:  Yes Taking medication as prescribed:  Yes. Patient on forced medications Tolerating medication:  Yes. Family/Significant othe contact made:  CSW spoke with the pt's fellow former church members from [place] Patient understands diagnosis:  No. Discussing patient identified problems/goals with staff:  Yes. Medical problems stabilized or resolved:  Yes. Denies suicidal/homicidal ideation: Yes. Issues/concerns per patient self-inventory:  No. Other:  New problem(s) identified: No, Describe:  none reported  Discharge Plan or Barriers:Patient will stabilize on medications and discharge home. Patient will follow up with her outpatient provider at discharge for medication management and therapy.   Reason for Continuation of Hospitalization: Homicidal ideation Mania  Comments: Patient on [facility] wait list. Not safe for discharge and is manic and agitated easily.  Patient is very disorganized.  Estimated length of stay: up to 7 days expected discharge Thursday 02/07/16  New goal(s):  Review of initial/current patient goals per problem list:   1.  Goal(s):participate in aftercare planning  Met:  No  Target date:by discharge  As evidenced by:patient participation in aftercare plan AEB aftercare provider and housing plan at discharge being identified  01/29/16: police went by patient home but no one answered. No confirmation on patient residence and if she can return. Patient not able to provide working number to relative or friend. Patient is currently on [facility] wait list as of Monday 01/28/16  01/31/16: patient still disorganized and not able to participate in discharge planning at this time.  02/06/16: Goal progressing.  2/13: Goal progressing.  2.  Goal (s):mania will become manageable  Met:  No  Target date:by discharge  As  evidenced by:patient demonstrates decreased signs and symptoms of mania 01/29/16: patient still manic, disorganized and labile, irritable with staff this morning wanting to discharge and thinks staff is [employer]. 01/31/16: CSW met with patient and MD to discuss discharge plan and patient became aggressive and agitated and not able to converse with staff as she was yelling and screaming.  2/8: Goal progressing.  2/13: Goal progressing.   Attendees: Physician:  Jolanta Pucilowska, MD 2/8/201710:20 AM  Nursing:   Gigi Maaniatu, RN 2/8/201710:20 AM  Other:   , LCSWA 2/8/201710:20 AM  Nursing: Jennifer Joyce, RN 2/8/201710:20 AM  Nursing:  2/8/201710:20 AM  Other: 2/8/201710:20 AM  Other:  2/8201710:20 AM  Other:  2/8/201710:20 AM  Other:  2/8/201710:20 AM  Other:  2/8/201710:20 AM  Other:  2/8/201710:20 AM  Other:   2/8/201710:20 AM    Scribe for Treatment Team:    F , MSW, LCSWA [phone]  02/11/2016, 5:52 PM       
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
## 2016-02-11 NOTE — [facility] Group Notes (Signed)
[facility] Group Notes:  (Nursing/MHT/Case Management/Adjunct)  Date:  02/11/2016  Time:  10:32 PM  Type of Therapy:  Group Therapy  Participation Level:  Active  Participation Quality:  Appropriate  Affect:  Appropriate  Cognitive:  Appropriate  Insight:  Appropriate  Engagement in Group:  Engaged  Modes of Intervention:  Discussion  Summary of Progress/Problems:  Jennifer Leon 02/11/2016, 10:32 PM

## 2016-02-11 NOTE — Progress Notes (Signed)
[facility] MD Progress Note  02/11/2016 1:35 PM Jennifer Leon  MRN:  [id number]  Subjective: Jennifer Leon seems much improved. She is no longer overtly paranoid or delusional. She's cool and collected. She is able to participate in discharge planning in a meaningful way. She still refuses oral medications but accepts injection. It is unlikely that she will take any medicines following discharge. Social worker spoke with her landlord. The patient may return to her apartment. She has money to pay for it. She is aware now that her husband is in nursing facility somewhere up [place]. She intends to bring him home. In talking to her friends and understand that the patient went well is an excellent care giver to her disabled husband. For sleep remains the biggest problem. The patient has not sleeping more than 3-4 hours a night but she consistently refuses sleeping aids.  Principal Problem: Bipolar I disorder, current or most recent episode manic, severe with mood-congruent psychotic features (HCC) Diagnosis:   Patient Active Problem List   Diagnosis Date Noted  . Bipolar I disorder, current or most recent episode manic, severe with mood-congruent psychotic features (HCC) [F31.13] 01/14/2016   Total Time spent with patient: 20 minutes  Past Psychiatric History: Bipolar disorder.  Past Medical History:  Past Medical History  Diagnosis Date  . Hypertension   . Anxiety   . GERD (gastroesophageal reflux disease)   . Noncompliance with medication regimen   . PTSD (post-traumatic stress disorder)   . Schizoaffective disorder (HCC)   . Psychosis   . Paranoid delusion [facility])     Past Surgical History  Procedure Laterality Date  . Eye surgery    . Abdominal surgery      blockage  . Bladder surgery     Family History: History reviewed. No pertinent family history. Family Psychiatric  History: Mother with mental illness. Social History:  History  Alcohol Use No     History  Drug Use No    Social  History   Social History  . Marital Status: Married    Spouse Name: N/A  . Number of Children: N/A  . Years of Education: N/A   Social History Main Topics  . Smoking status: Former Games developer  . Smokeless tobacco: None  . Alcohol Use: No  . Drug Use: No  . Sexual Activity: Not Asked   Other Topics Concern  . None   Social History Narrative   Additional Social History:    Pain Medications: See PTA meds Prescriptions: See PTA meds Over the Counter: See PTA meds History of alcohol / drug use?: No history of alcohol / drug abuse                    Sleep: Poor  Appetite:  Fair  Current Medications: Current Facility-Administered Medications  Medication Dose Route Frequency Provider Last Rate Last Dose  . acetaminophen (TYLENOL) tablet 650 mg  650 mg Oral Q6H PRN Sheana Bir B Soham Hollett, MD      . aspirin chewable tablet 162 mg  162 mg Oral Daily Shari Prows, MD   162 mg at 01/23/16 0821  . magnesium hydroxide (MILK OF MAGNESIA) suspension 30 mL  30 mL Oral Daily PRN Lemoyne Scarpati B Bohdi Leeds, MD      . OLANZapine zydis (ZYPREXA) disintegrating tablet 20 mg  20 mg Oral QAC lunch Shari Prows, MD       Or  . OLANZapine (ZYPREXA) injection 10 mg  10 mg Intramuscular QAC lunch Sherrel Ploch B  Brandelyn Henne, MD   10 mg at 02/11/16 1234  . traZODone (DESYREL) tablet 150 mg  150 mg Oral QHS PRN Shari Prows, MD   150 mg at 01/24/16 0012    Lab Results: No results found for this or any previous visit (from the past 48 hour(s)).  Physical Findings: AIMS: Facial and Oral Movements Muscles of Facial Expression: None, normal Lips and Perioral Area: None, normal Jaw: None, normal Tongue: None, normal,Extremity Movements Upper (arms, wrists, hands, fingers): None, normal Lower (legs, knees, ankles, toes): None, normal, Trunk Movements Neck, shoulders, hips: None, normal, Overall Severity Severity of abnormal movements (highest score from questions above): None,  normal Incapacitation due to abnormal movements: None, normal Patient's awareness of abnormal movements (rate only patient's report): No Awareness, Dental Status Current problems with teeth and/or dentures?: No Does patient usually wear dentures?: No  CIWA:    COWS:     Musculoskeletal: Strength & Muscle Tone: within normal limits Gait & Station: normal Patient leans: N/A  Psychiatric Specialty Exam: Review of Systems  Psychiatric/Behavioral: The patient has insomnia.   All other systems reviewed and are negative.   Blood pressure 127/77, pulse 79, temperature 98.2 F (36.8 C), temperature source Oral, resp. rate 18, height 5' 4.96" (1.65 m), weight 55.339 kg (122 lb), SpO2 100 %.Body mass index is 20.33 kg/(m^2).  General Appearance: Casual  Eye Contact::  Good  Speech:  Clear and Coherent  Volume:  Normal  Mood:  Euthymic  Affect:  Appropriate  Thought Process:  Goal Directed  Orientation:  Full (Time, Place, and Person)  Thought Content:  WDL  Suicidal Thoughts:  No  Homicidal Thoughts:  No  Memory:  Immediate;   Fair Recent;   Fair Remote;   Fair  Judgement:  Impaired  Insight:  Shallow  Psychomotor Activity:  Normal  Concentration:  Fair  Recall:  [employer] of Knowledge:Fair  Language: Fair  Akathisia:  No  Handed:  Right  AIMS (if indicated):     Assets:  Communication Skills Desire for Improvement Financial Resources/Insurance Housing Physical Health Resilience Social Support  ADL's:  Intact  Cognition: WNL  Sleep:  Number of Hours: 3.25   Treatment Plan Summary: Daily contact with patient to assess and evaluate symptoms and progress in treatment and Medication management   Jennifer Leon is a 65 year old female with a history of bipolar disorder admitted for disorganized, paranoid, agitated and bizarre behavior.  1. Mood/psychosis. The patient has been consistently refusing oral medications. She has been given daily Zyprexa injections, initially by  forced medication order lately by consent.   2. Coronary artery disease. Vital signs are stable. We continued ASA that she consistently refused.   4. Metabolic syndrome. Lipid panel, hemoglobin A1C, TSH and prolactin are normal.    5. Insomnia. The patient sleeps only 3-4 hours a night. She refuses any sleeping aids.  6. Social. Psychotic disorganization and paranoia the patient initially has not been able to provide [place] with any meaningful information. We were able to eventually to talk to her landlord and the bank. The patient has a place to go and means to pay for it.  7. Disposition. She was discharged home. She will follow up with [facility].   Kristine Linea, MD 02/11/2016, 1:35 PM

## 2016-02-11 NOTE — Progress Notes (Signed)
D: Pt denies SI/HI/AVH. Pt is pleasant and cooperative. Pt seen on unit, pt has minimal interaction with peers/ staff, but will engage with others .   A: Pt was offered support and encouragement. Pt was given scheduled medications. Pt was encourage to attend groups. Q 15 minute checks were done for safety.   R:Pt attends groups and interacts well with peers and staff. Pt is taking medication. Pt has no complaints at this time.Pt receptive to treatment and safety maintained on unit.

## 2016-02-11 NOTE — Progress Notes (Signed)
D:  Patient is alert and oriented on the unit this shift.  Patient attended and actively participated in groups today.  Patient denies suicidal ideation, homicidal ideation, auditory or visual hallucinations at the present time.   A:  Scheduled IM medication administered to patient as per MD orders.  Emotional support and encouragement are provided.  Patient is maintained on q.15 minute safety checks.  Patient is informed to notify staff with questions or concerns. R:  No adverse medication reactions are noted.  Patient is cooperative with IM medication administration.  Patient continues to refuse all po medications.     Patient remains safe at this time.

## 2016-02-11 NOTE — Plan of Care (Signed)
Problem: Ineffective individual coping Goal: LTG: Patient will report a decrease in negative feelings Outcome: Not Progressing Patient was very verbally aggressive today and continues to say we are holding her here against her will.

## 2016-02-11 NOTE — Plan of Care (Signed)
Problem: Ineffective individual coping Goal: STG-Increase in ability to manage activities of daily living Outcome: Progressing Patient is able to manage her activities of daily living     

## 2016-02-12 DIAGNOSIS — F3113 Bipolar disorder, current episode manic without psychotic features, severe: Secondary | ICD-10-CM
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
## 2016-02-12 NOTE — [facility] Suicide Risk Assessment (Deleted)
[facility] Discharge Suicide Risk Assessment   Principal Problem: Bipolar I disorder, current or most recent episode manic, severe with mood-congruent psychotic features [facility]) Discharge Diagnoses:  Patient Active Problem List   Diagnosis Date Noted  . Bipolar I disorder, current or most recent episode manic, severe with mood-congruent psychotic features (HCC) [F31.13] 01/14/2016    Total Time spent with patient: 30 minutes    Psychiatric Specialty Exam: ROS                                                         Mental Status Per Nursing Assessment::   On Admission:  NA  Demographic Factors:  Caucasian  Loss Factors: NA  Historical Factors: Impulsivity  Risk Reduction Factors:   Positive social support  Continued Clinical Symptoms:  Previous Psychiatric Diagnoses and Treatments  Cognitive Features That Contribute To Risk:  None    Suicide Risk:  Minimal: No identifiable suicidal ideation.  Patients presenting with no risk factors but with morbid ruminations; may be classified as minimal risk based on the severity of the depressive symptoms  Follow-up Information    Follow up with [facility].   Why:  Please arrive to the walk-in clinic between the hours of 9am-4pm Monday through Friday for an assessment for medication management and therapy   Contact information:   [address] [place], [place] [address] Phone: [phone] Fax: [phone]       Jimmy Footman, MD 02/12/2016, 10:17 AM
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
## 2016-02-12 NOTE — [facility] Suicide Risk Assessment (Signed)
[facility] Discharge Suicide Risk Assessment   Principal Problem: Bipolar I disorder, current or most recent episode manic, severe with mood-congruent psychotic features [facility]) Discharge Diagnoses:  Patient Active Problem List   Diagnosis Date Noted  . Bipolar I disorder, current or most recent episode manic, severe with mood-congruent psychotic features (HCC) [F31.13] 01/14/2016    Total Time spent with patient: 30 minutes  Musculoskeletal: Strength & Muscle Tone: within normal limits Gait & Station: normal Patient leans: N/A  Psychiatric Specialty Exam: Review of Systems  Constitutional: Negative.   HENT: Negative.   Eyes: Negative.   Respiratory: Negative.   Cardiovascular: Negative.   Gastrointestinal: Negative.   Genitourinary: Negative.   Musculoskeletal: Negative.   Skin: Negative.   Neurological: Negative.   Endo/Heme/Allergies: Negative.   Psychiatric/Behavioral: Negative.     Blood pressure 99/75, pulse 76, temperature 97.6 F (36.4 C), temperature source Oral, resp. rate 18, height 5' 4.96" (1.65 m), weight 55.339 kg (122 lb), SpO2 100 %.Body mass index is 20.33 kg/(m^2).  General Appearance: Fairly Groomed  Patent attorney::  Good  Speech:  Clear and Coherent409  Volume:  Normal  Mood:  Irritable  Affect:  Constricted  Thought Process:  Linear  Orientation:  Full (Time, Place, and Person)  Thought Content:  Hallucinations: None  Suicidal Thoughts:  No  Homicidal Thoughts:  No  Memory:  Immediate;   Good Recent;   Good Remote;   Good  Judgement:  Fair  Insight:  Shallow  Psychomotor Activity:  Normal  Concentration:  Good  Recall:  Good  Fund of Knowledge:Good  Language: Good  Akathisia:  No  Handed:    AIMS (if indicated):     Assets:  Communication Skills Physical Health  Sleep:  Number of Hours: 7.45  Cognition: WNL  ADL's:  Intact   Mental Status Per Nursing Assessment::   On Admission:  NA  Demographic Factors:  Caucasian and Living  alone  Loss Factors: NA  Historical Factors: Impulsivity  Risk Reduction Factors:   Religious beliefs about death  Continued Clinical Symptoms:  Previous Psychiatric Diagnoses and Treatments  Cognitive Features That Contribute To Risk:  None    Suicide Risk:  Minimal: No identifiable suicidal ideation.  Patients presenting with no risk factors but with morbid ruminations; may be classified as minimal risk based on the severity of the depressive symptoms  Follow-up Information    Follow up with [facility].   Why:  Please arrive to the walk-in clinic between the hours of 9am-4pm Monday through Friday for an assessment for medication management and therapy   Contact information:   [address] [place], [place] [address] Phone: [phone] Fax: [phone]       Jimmy Footman, MD 02/12/2016, 10:19 AM

## 2016-02-12 NOTE — Progress Notes (Signed)
  [facility] Adult Case Management Discharge Plan :  Will you be returning to the same living situation after discharge:  Yes,  pt will be returning to her home in [place], [place] At discharge, do you have transportation home?: Yes,  Pt will be transported by [facility]'s Department Do you have the ability to pay for your medications: Yes,  pt will be provided with prescriptions at discharge  Release of information consent forms completed and in the chart;  Patient's signature needed at discharge.  Patient to Follow up at: Follow-up Information    Follow up with [facility].   Why:  Please arrive to the walk-in clinic between the hours of 9am-4pm Monday through Friday for an assessment for medication management and therapy   Contact information:   [address] [place], [place] [address] Phone: [phone] Fax: [phone]      Next level of care provider has access to [facility] Link:no  Safety Planning and Suicide Prevention discussed: Yes,  CSW completed with pt  Have you used any form of tobacco in the last 30 days? (Cigarettes, Smokeless Tobacco, Cigars, and/or Pipes): No  Has patient been referred to the Quitline?: N/A patient is not a smoker  Patient has been referred for addiction treatment: N/A  Mercy Riding 02/12/2016, 10:16 AM

## 2016-02-25 DIAGNOSIS — R079 Chest pain, unspecified: Secondary | ICD-10-CM | POA: Diagnosis not present

## 2016-02-25 DIAGNOSIS — Z9114 Patient's other noncompliance with medication regimen: Secondary | ICD-10-CM | POA: Diagnosis not present

## 2016-02-25 DIAGNOSIS — I10 Essential (primary) hypertension: Secondary | ICD-10-CM | POA: Diagnosis not present

## 2016-02-25 DIAGNOSIS — F209 Schizophrenia, unspecified: Secondary | ICD-10-CM | POA: Diagnosis not present

## 2016-02-25 DIAGNOSIS — M79622 Pain in left upper arm: Secondary | ICD-10-CM | POA: Diagnosis not present

## 2016-02-25 DIAGNOSIS — M79602 Pain in left arm: Secondary | ICD-10-CM | POA: Diagnosis not present

## 2016-02-25 DIAGNOSIS — Z590 Homelessness: Secondary | ICD-10-CM | POA: Diagnosis not present

## 2016-05-01 IMAGING — CR DG HIP (WITH OR WITHOUT PELVIS) 2-3V*R*
3 series · 3 of 3 positions shown · non-contrast
Comparison: None.

CLINICAL DATA: Constant moderate RIGHT pain for 1 week, air RIGHT
hip gave out ; worse with ambulation.

EXAM:
DG HIP W/ PELVIS 2-3V*R*

[t pelvis ap]
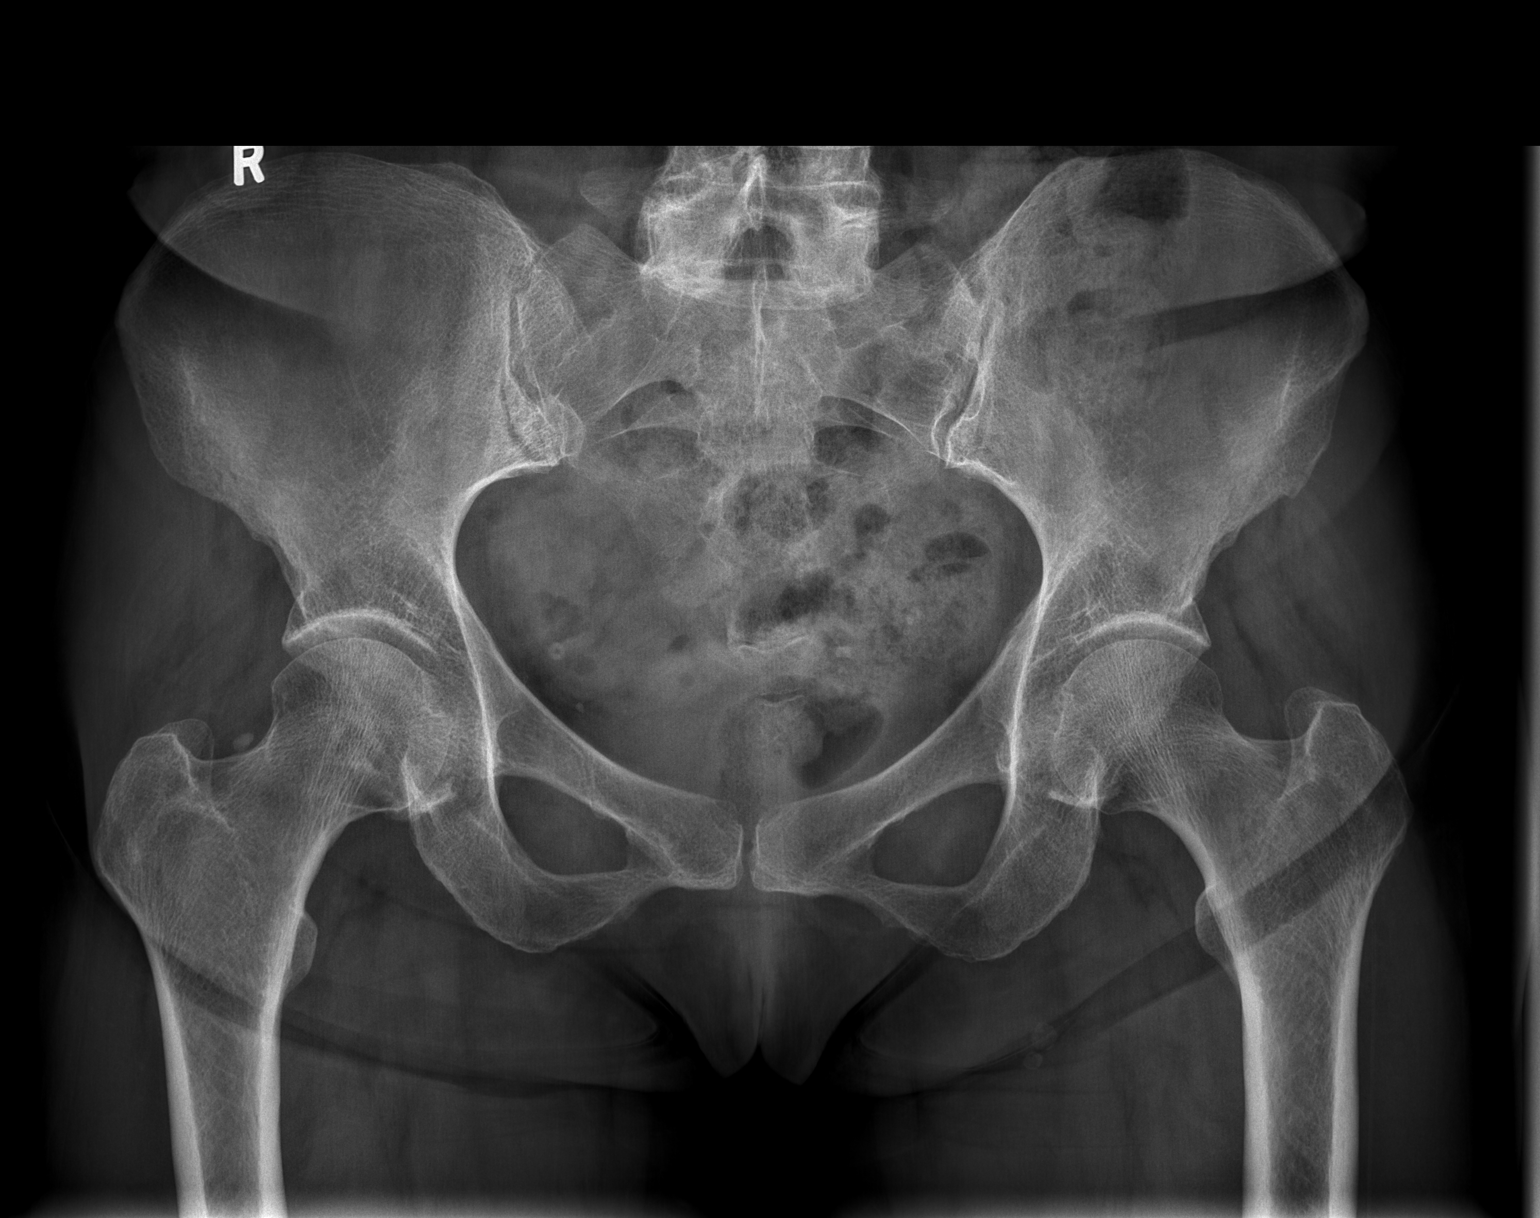

[t hip ap right]
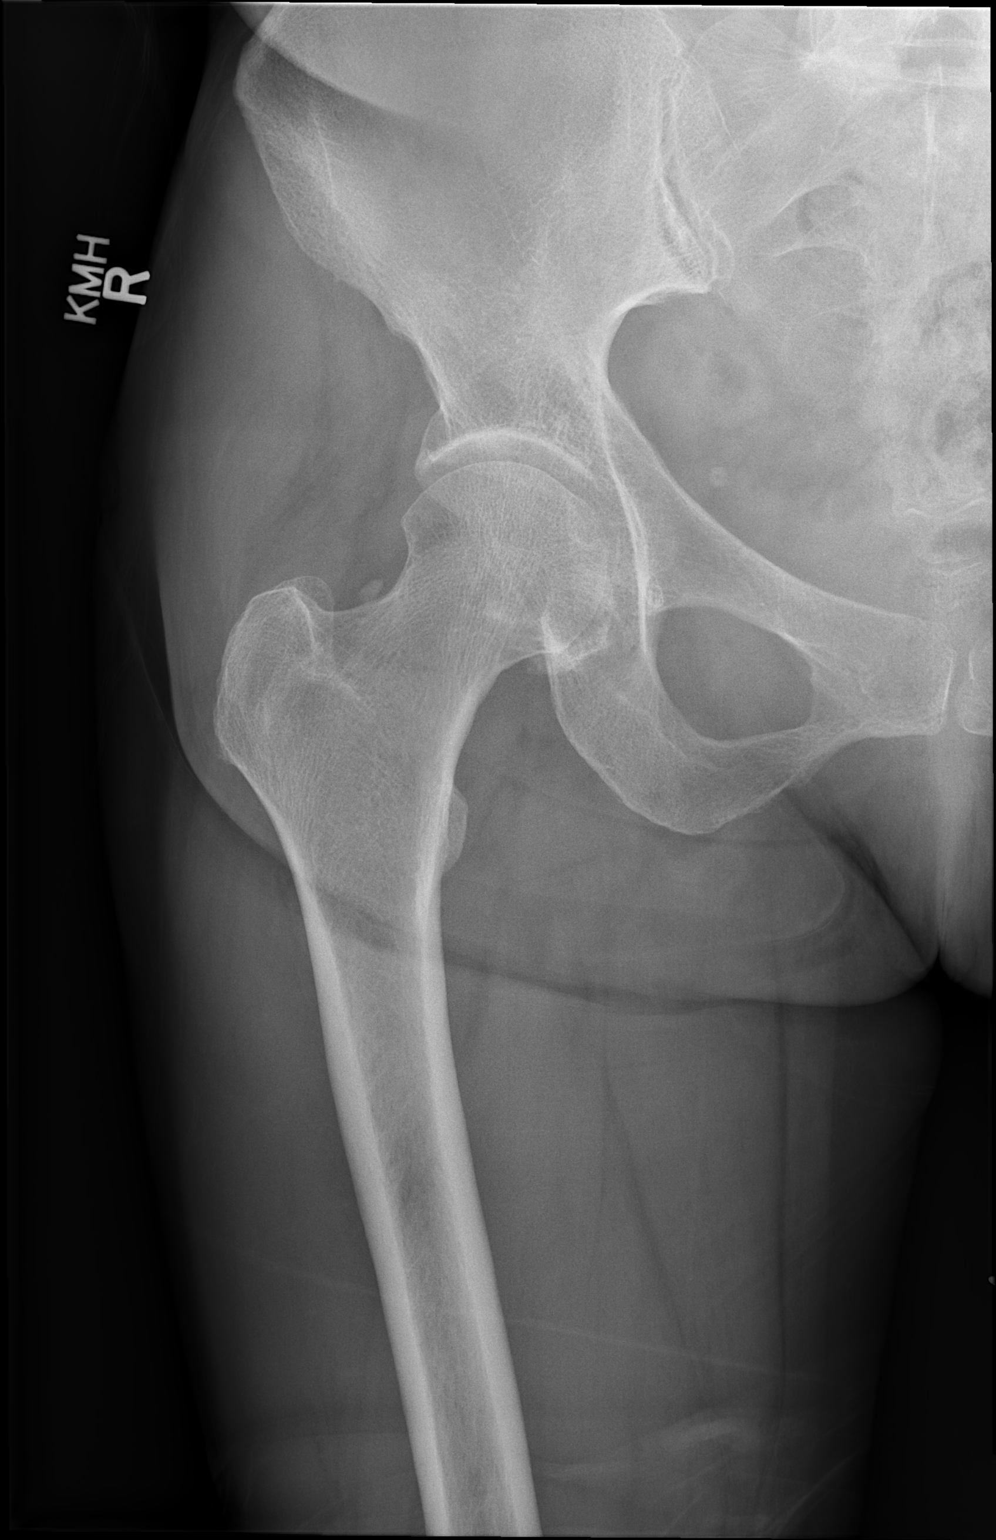

[t hip frog leg right]
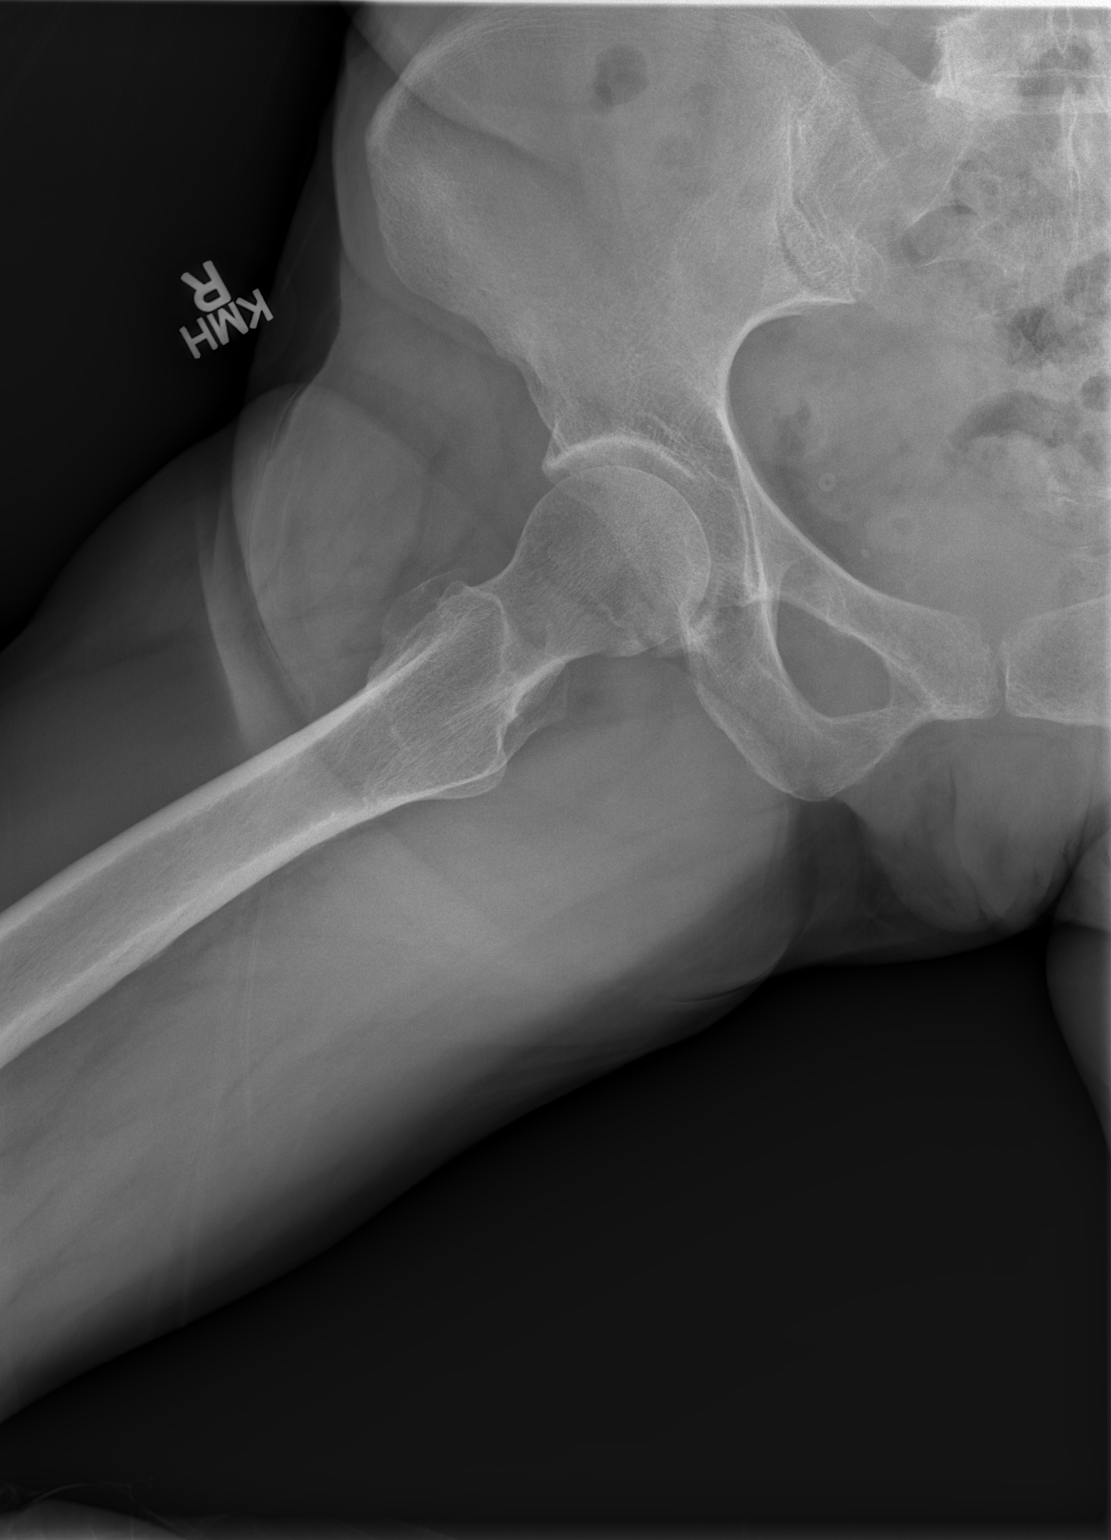

[3 of 3 positions shown; findings below may reference images not displayed]

FINDINGS: No acute fracture deformity nor dislocation. Sclerotic line at the
RIGHT femoral head neck junction. No destructive bony lesions. Joint
spaces intact. Subcentimeter possible loose body RIGHT femoral neck.
Moderate degenerative sacroiliac joints. Soft tissue planes are
nonsuspicious.
IMPRESSION: No acute fracture deformity or dislocation. No advanced degenerative
change for age.

Sclerotic line at RIGHT femoral head neck junction could reflect
stress injury.

  By: Nelvis Tangua

## 2016-06-01 DIAGNOSIS — F419 Anxiety disorder, unspecified: Secondary | ICD-10-CM | POA: Diagnosis not present

## 2016-06-01 DIAGNOSIS — I10 Essential (primary) hypertension: Secondary | ICD-10-CM | POA: Diagnosis not present

## 2016-06-01 DIAGNOSIS — R252 Cramp and spasm: Secondary | ICD-10-CM | POA: Diagnosis not present

## 2016-06-01 DIAGNOSIS — Z5329 Procedure and treatment not carried out because of patient's decision for other reasons: Secondary | ICD-10-CM | POA: Diagnosis not present

## 2016-06-03 DIAGNOSIS — R110 Nausea: Secondary | ICD-10-CM | POA: Diagnosis not present

## 2016-06-03 DIAGNOSIS — R32 Unspecified urinary incontinence: Secondary | ICD-10-CM | POA: Diagnosis not present

## 2016-07-24 DIAGNOSIS — Z008 Encounter for other general examination: Secondary | ICD-10-CM | POA: Diagnosis not present

## 2016-07-24 DIAGNOSIS — Z743 Need for continuous supervision: Secondary | ICD-10-CM | POA: Diagnosis not present

## 2016-07-24 DIAGNOSIS — F29 Unspecified psychosis not due to a substance or known physiological condition: Secondary | ICD-10-CM | POA: Diagnosis not present

## 2016-07-24 DIAGNOSIS — R461 Bizarre personal appearance: Secondary | ICD-10-CM | POA: Diagnosis not present

## 2016-07-25 DIAGNOSIS — F259 Schizoaffective disorder, unspecified: Secondary | ICD-10-CM | POA: Diagnosis not present

## 2016-07-25 DIAGNOSIS — R4182 Altered mental status, unspecified: Secondary | ICD-10-CM | POA: Diagnosis not present

## 2016-07-25 DIAGNOSIS — F319 Bipolar disorder, unspecified: Secondary | ICD-10-CM | POA: Diagnosis not present

## 2016-07-26 DIAGNOSIS — F489 Nonpsychotic mental disorder, unspecified: Secondary | ICD-10-CM | POA: Diagnosis not present

## 2016-07-26 DIAGNOSIS — F29 Unspecified psychosis not due to a substance or known physiological condition: Secondary | ICD-10-CM | POA: Diagnosis not present

## 2016-07-26 DIAGNOSIS — F250 Schizoaffective disorder, bipolar type: Secondary | ICD-10-CM | POA: Diagnosis not present

## 2016-07-26 DIAGNOSIS — Z9114 Patient's other noncompliance with medication regimen: Secondary | ICD-10-CM | POA: Diagnosis not present

## 2016-07-26 DIAGNOSIS — Z590 Homelessness: Secondary | ICD-10-CM | POA: Diagnosis not present

## 2016-07-26 DIAGNOSIS — R451 Restlessness and agitation: Secondary | ICD-10-CM | POA: Diagnosis not present

## 2016-07-26 DIAGNOSIS — E271 Primary adrenocortical insufficiency: Secondary | ICD-10-CM | POA: Diagnosis not present

## 2016-07-26 DIAGNOSIS — Z7401 Bed confinement status: Secondary | ICD-10-CM | POA: Diagnosis not present

## 2016-07-26 DIAGNOSIS — F419 Anxiety disorder, unspecified: Secondary | ICD-10-CM | POA: Diagnosis not present

## 2016-07-26 DIAGNOSIS — F259 Schizoaffective disorder, unspecified: Secondary | ICD-10-CM | POA: Diagnosis not present

## 2016-07-26 DIAGNOSIS — Z743 Need for continuous supervision: Secondary | ICD-10-CM | POA: Diagnosis not present

## 2016-07-26 DIAGNOSIS — F209 Schizophrenia, unspecified: Secondary | ICD-10-CM | POA: Diagnosis not present

## 2016-07-26 DIAGNOSIS — F319 Bipolar disorder, unspecified: Secondary | ICD-10-CM | POA: Diagnosis not present

## 2016-07-26 DIAGNOSIS — F17210 Nicotine dependence, cigarettes, uncomplicated: Secondary | ICD-10-CM | POA: Diagnosis not present

## 2016-07-26 DIAGNOSIS — R456 Violent behavior: Secondary | ICD-10-CM | POA: Diagnosis not present

## 2016-07-26 DIAGNOSIS — R030 Elevated blood-pressure reading, without diagnosis of hypertension: Secondary | ICD-10-CM | POA: Diagnosis not present

## 2016-07-26 DIAGNOSIS — I10 Essential (primary) hypertension: Secondary | ICD-10-CM | POA: Diagnosis not present

## 2016-07-26 DIAGNOSIS — F603 Borderline personality disorder: Secondary | ICD-10-CM | POA: Diagnosis not present

## 2016-07-26 DIAGNOSIS — Z008 Encounter for other general examination: Secondary | ICD-10-CM | POA: Diagnosis not present

## 2016-08-09 DIAGNOSIS — F250 Schizoaffective disorder, bipolar type: Secondary | ICD-10-CM | POA: Diagnosis not present

## 2016-08-10 DIAGNOSIS — F250 Schizoaffective disorder, bipolar type: Secondary | ICD-10-CM | POA: Diagnosis not present

## 2016-08-11 DIAGNOSIS — F250 Schizoaffective disorder, bipolar type: Secondary | ICD-10-CM | POA: Diagnosis not present

## 2016-08-12 DIAGNOSIS — F250 Schizoaffective disorder, bipolar type: Secondary | ICD-10-CM | POA: Diagnosis not present

## 2016-08-13 DIAGNOSIS — F250 Schizoaffective disorder, bipolar type: Secondary | ICD-10-CM | POA: Diagnosis not present

## 2016-08-14 DIAGNOSIS — F250 Schizoaffective disorder, bipolar type: Secondary | ICD-10-CM | POA: Diagnosis not present

## 2016-08-15 DIAGNOSIS — F250 Schizoaffective disorder, bipolar type: Secondary | ICD-10-CM | POA: Diagnosis not present

## 2016-08-16 DIAGNOSIS — F250 Schizoaffective disorder, bipolar type: Secondary | ICD-10-CM | POA: Diagnosis not present

## 2016-08-17 DIAGNOSIS — F250 Schizoaffective disorder, bipolar type: Secondary | ICD-10-CM | POA: Diagnosis not present

## 2016-08-18 DIAGNOSIS — F250 Schizoaffective disorder, bipolar type: Secondary | ICD-10-CM | POA: Diagnosis not present

## 2016-08-19 DIAGNOSIS — F250 Schizoaffective disorder, bipolar type: Secondary | ICD-10-CM | POA: Diagnosis not present

## 2016-08-20 DIAGNOSIS — F250 Schizoaffective disorder, bipolar type: Secondary | ICD-10-CM | POA: Diagnosis not present

## 2016-08-21 DIAGNOSIS — F250 Schizoaffective disorder, bipolar type: Secondary | ICD-10-CM | POA: Diagnosis not present

## 2016-08-22 DIAGNOSIS — F250 Schizoaffective disorder, bipolar type: Secondary | ICD-10-CM | POA: Diagnosis not present

## 2016-08-24 DIAGNOSIS — F250 Schizoaffective disorder, bipolar type: Secondary | ICD-10-CM | POA: Diagnosis not present

## 2016-08-25 DIAGNOSIS — F250 Schizoaffective disorder, bipolar type: Secondary | ICD-10-CM | POA: Diagnosis not present

## 2016-08-27 DIAGNOSIS — F250 Schizoaffective disorder, bipolar type: Secondary | ICD-10-CM | POA: Diagnosis not present

## 2016-09-02 DIAGNOSIS — I10 Essential (primary) hypertension: Secondary | ICD-10-CM | POA: Diagnosis not present

## 2016-09-02 DIAGNOSIS — S20212A Contusion of left front wall of thorax, initial encounter: Secondary | ICD-10-CM | POA: Diagnosis not present

## 2016-09-02 DIAGNOSIS — M25561 Pain in right knee: Secondary | ICD-10-CM | POA: Diagnosis not present

## 2016-09-02 DIAGNOSIS — M1990 Unspecified osteoarthritis, unspecified site: Secondary | ICD-10-CM | POA: Diagnosis not present

## 2016-09-02 DIAGNOSIS — R21 Rash and other nonspecific skin eruption: Secondary | ICD-10-CM | POA: Diagnosis not present

## 2016-09-02 DIAGNOSIS — S60511A Abrasion of right hand, initial encounter: Secondary | ICD-10-CM | POA: Diagnosis not present

## 2016-09-02 DIAGNOSIS — M2391 Unspecified internal derangement of right knee: Secondary | ICD-10-CM | POA: Diagnosis not present

## 2016-09-02 DIAGNOSIS — M1711 Unilateral primary osteoarthritis, right knee: Secondary | ICD-10-CM | POA: Diagnosis not present

## 2016-09-02 DIAGNOSIS — R531 Weakness: Secondary | ICD-10-CM | POA: Diagnosis not present

## 2016-09-02 DIAGNOSIS — Z743 Need for continuous supervision: Secondary | ICD-10-CM | POA: Diagnosis not present

## 2016-09-02 DIAGNOSIS — M549 Dorsalgia, unspecified: Secondary | ICD-10-CM | POA: Diagnosis not present

## 2016-09-02 DIAGNOSIS — Z23 Encounter for immunization: Secondary | ICD-10-CM | POA: Diagnosis not present

## 2016-09-02 DIAGNOSIS — F259 Schizoaffective disorder, unspecified: Secondary | ICD-10-CM | POA: Diagnosis not present

## 2016-09-02 DIAGNOSIS — L539 Erythematous condition, unspecified: Secondary | ICD-10-CM | POA: Diagnosis not present

## 2016-09-02 DIAGNOSIS — Z8739 Personal history of other diseases of the musculoskeletal system and connective tissue: Secondary | ICD-10-CM | POA: Diagnosis not present

## 2016-09-02 DIAGNOSIS — R404 Transient alteration of awareness: Secondary | ICD-10-CM | POA: Diagnosis not present

## 2016-09-02 DIAGNOSIS — H6123 Impacted cerumen, bilateral: Secondary | ICD-10-CM | POA: Diagnosis not present

## 2016-09-02 DIAGNOSIS — F250 Schizoaffective disorder, bipolar type: Secondary | ICD-10-CM | POA: Diagnosis not present

## 2016-09-03 DIAGNOSIS — F250 Schizoaffective disorder, bipolar type: Secondary | ICD-10-CM | POA: Diagnosis not present

## 2016-09-04 DIAGNOSIS — H6123 Impacted cerumen, bilateral: Secondary | ICD-10-CM | POA: Diagnosis not present

## 2016-09-04 DIAGNOSIS — F250 Schizoaffective disorder, bipolar type: Secondary | ICD-10-CM | POA: Diagnosis not present

## 2016-09-06 DIAGNOSIS — F250 Schizoaffective disorder, bipolar type: Secondary | ICD-10-CM | POA: Diagnosis not present

## 2016-09-08 DIAGNOSIS — F250 Schizoaffective disorder, bipolar type: Secondary | ICD-10-CM | POA: Diagnosis not present

## 2016-09-09 DIAGNOSIS — F250 Schizoaffective disorder, bipolar type: Secondary | ICD-10-CM | POA: Diagnosis not present

## 2016-09-10 DIAGNOSIS — F250 Schizoaffective disorder, bipolar type: Secondary | ICD-10-CM | POA: Diagnosis not present

## 2016-09-16 DIAGNOSIS — F250 Schizoaffective disorder, bipolar type: Secondary | ICD-10-CM | POA: Diagnosis not present

## 2016-09-19 DIAGNOSIS — F250 Schizoaffective disorder, bipolar type: Secondary | ICD-10-CM | POA: Diagnosis not present

## 2016-09-24 DIAGNOSIS — F250 Schizoaffective disorder, bipolar type: Secondary | ICD-10-CM | POA: Diagnosis not present

## 2016-10-01 DIAGNOSIS — F250 Schizoaffective disorder, bipolar type: Secondary | ICD-10-CM | POA: Diagnosis not present

## 2016-10-04 DIAGNOSIS — M25561 Pain in right knee: Secondary | ICD-10-CM | POA: Diagnosis not present

## 2016-10-04 DIAGNOSIS — M1990 Unspecified osteoarthritis, unspecified site: Secondary | ICD-10-CM | POA: Diagnosis not present

## 2016-10-08 DIAGNOSIS — M2391 Unspecified internal derangement of right knee: Secondary | ICD-10-CM | POA: Diagnosis not present

## 2016-10-09 DIAGNOSIS — F250 Schizoaffective disorder, bipolar type: Secondary | ICD-10-CM | POA: Diagnosis not present

## 2016-10-15 DIAGNOSIS — F250 Schizoaffective disorder, bipolar type: Secondary | ICD-10-CM | POA: Diagnosis not present

## 2016-10-16 DIAGNOSIS — R21 Rash and other nonspecific skin eruption: Secondary | ICD-10-CM | POA: Diagnosis not present

## 2016-10-16 DIAGNOSIS — M25561 Pain in right knee: Secondary | ICD-10-CM | POA: Diagnosis not present

## 2016-10-20 DIAGNOSIS — F250 Schizoaffective disorder, bipolar type: Secondary | ICD-10-CM | POA: Diagnosis not present

## 2016-11-01 DIAGNOSIS — M1990 Unspecified osteoarthritis, unspecified site: Secondary | ICD-10-CM | POA: Diagnosis not present

## 2016-11-01 DIAGNOSIS — S60511A Abrasion of right hand, initial encounter: Secondary | ICD-10-CM | POA: Diagnosis not present

## 2016-11-01 DIAGNOSIS — F250 Schizoaffective disorder, bipolar type: Secondary | ICD-10-CM | POA: Diagnosis not present

## 2016-12-31 DIAGNOSIS — F250 Schizoaffective disorder, bipolar type: Secondary | ICD-10-CM | POA: Diagnosis not present

## 2017-02-03 DIAGNOSIS — F259 Schizoaffective disorder, unspecified: Secondary | ICD-10-CM | POA: Diagnosis not present

## 2017-02-06 DIAGNOSIS — F259 Schizoaffective disorder, unspecified: Secondary | ICD-10-CM | POA: Diagnosis not present

## 2017-02-10 DIAGNOSIS — F250 Schizoaffective disorder, bipolar type: Secondary | ICD-10-CM | POA: Diagnosis not present

## 2017-02-19 DIAGNOSIS — Z8739 Personal history of other diseases of the musculoskeletal system and connective tissue: Secondary | ICD-10-CM | POA: Diagnosis not present

## 2017-02-19 DIAGNOSIS — F259 Schizoaffective disorder, unspecified: Secondary | ICD-10-CM | POA: Diagnosis not present

## 2017-02-21 DIAGNOSIS — F259 Schizoaffective disorder, unspecified: Secondary | ICD-10-CM | POA: Diagnosis not present

## 2017-02-22 DIAGNOSIS — F259 Schizoaffective disorder, unspecified: Secondary | ICD-10-CM | POA: Diagnosis not present

## 2017-02-23 DIAGNOSIS — F259 Schizoaffective disorder, unspecified: Secondary | ICD-10-CM | POA: Diagnosis not present

## 2017-02-26 DIAGNOSIS — Z8739 Personal history of other diseases of the musculoskeletal system and connective tissue: Secondary | ICD-10-CM | POA: Diagnosis not present

## 2017-02-28 IMAGING — CR DG CHEST 2V
2 series · 2 of 2 positions shown · non-contrast
Comparison: 12/05/2014

CLINICAL DATA: Chest pain, diaphoresis and left arm pain today.

EXAM:
CHEST  2 VIEW

[chest pa]
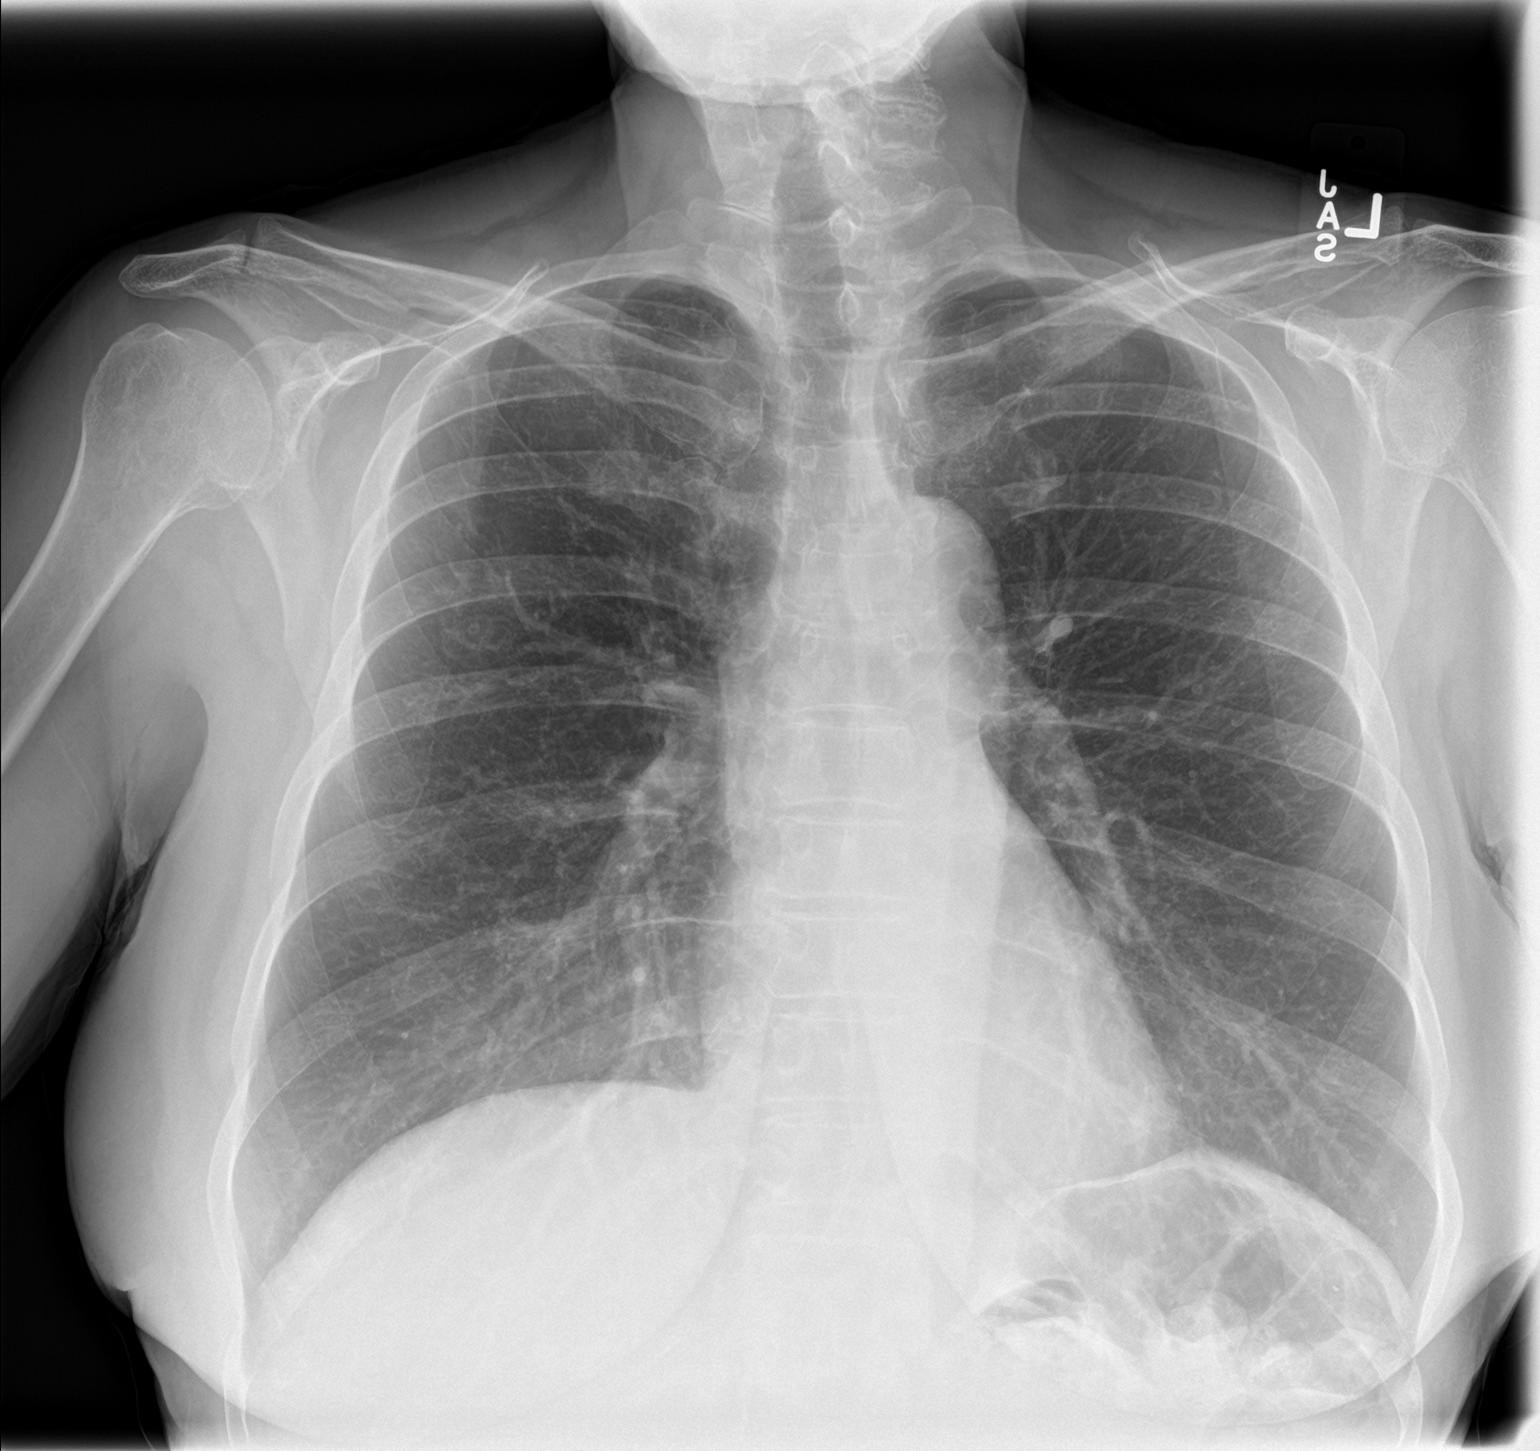

[chest lat]
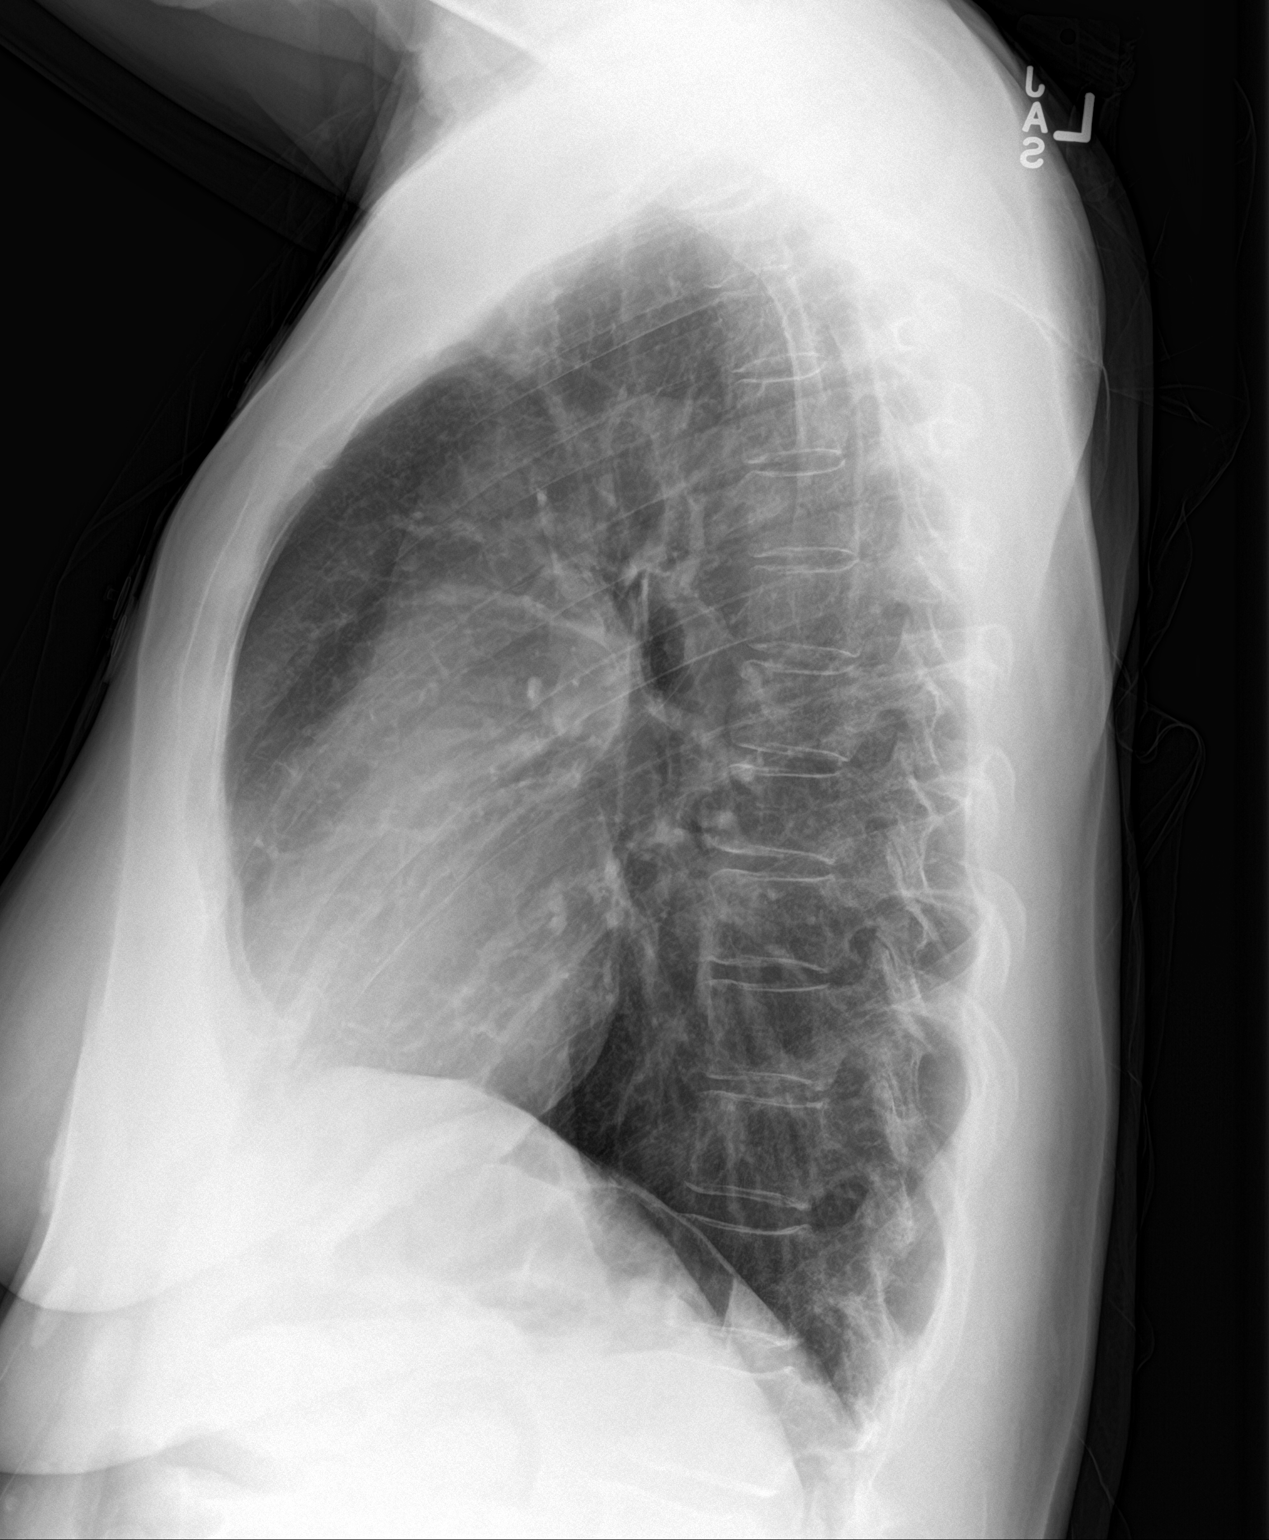

[2 of 2 positions shown; findings below may reference images not displayed]

FINDINGS: The cardiac silhouette, mediastinal and hilar contours are within
normal limits and stable. Mild tortuosity of the thoracic aorta. The
lungs are clear. No pleural effusion. The bony thorax is intact.
IMPRESSION: No acute cardiopulmonary findings.

## 2017-03-01 DIAGNOSIS — F250 Schizoaffective disorder, bipolar type: Secondary | ICD-10-CM | POA: Diagnosis not present

## 2017-03-04 DIAGNOSIS — F250 Schizoaffective disorder, bipolar type: Secondary | ICD-10-CM | POA: Diagnosis not present

## 2017-04-01 DIAGNOSIS — Z8739 Personal history of other diseases of the musculoskeletal system and connective tissue: Secondary | ICD-10-CM | POA: Diagnosis not present

## 2017-04-02 DIAGNOSIS — F250 Schizoaffective disorder, bipolar type: Secondary | ICD-10-CM | POA: Diagnosis not present

## 2017-04-18 DIAGNOSIS — F250 Schizoaffective disorder, bipolar type: Secondary | ICD-10-CM | POA: Diagnosis not present

## 2017-04-23 DIAGNOSIS — F250 Schizoaffective disorder, bipolar type: Secondary | ICD-10-CM | POA: Diagnosis not present

## 2017-04-25 DIAGNOSIS — F250 Schizoaffective disorder, bipolar type: Secondary | ICD-10-CM | POA: Diagnosis not present

## 2017-04-27 DIAGNOSIS — F250 Schizoaffective disorder, bipolar type: Secondary | ICD-10-CM | POA: Diagnosis not present

## 2017-04-29 DIAGNOSIS — F250 Schizoaffective disorder, bipolar type: Secondary | ICD-10-CM | POA: Diagnosis not present

## 2017-04-30 DIAGNOSIS — Z8739 Personal history of other diseases of the musculoskeletal system and connective tissue: Secondary | ICD-10-CM | POA: Diagnosis not present

## 2017-04-30 DIAGNOSIS — R531 Weakness: Secondary | ICD-10-CM | POA: Diagnosis not present

## 2017-04-30 DIAGNOSIS — F250 Schizoaffective disorder, bipolar type: Secondary | ICD-10-CM | POA: Diagnosis not present

## 2017-04-30 DIAGNOSIS — S20212A Contusion of left front wall of thorax, initial encounter: Secondary | ICD-10-CM | POA: Diagnosis not present

## 2017-06-29 DIAGNOSIS — F250 Schizoaffective disorder, bipolar type: Secondary | ICD-10-CM | POA: Diagnosis not present

## 2017-07-02 DIAGNOSIS — R531 Weakness: Secondary | ICD-10-CM | POA: Diagnosis not present

## 2017-07-02 DIAGNOSIS — M549 Dorsalgia, unspecified: Secondary | ICD-10-CM | POA: Diagnosis not present

## 2017-07-02 DIAGNOSIS — Z8739 Personal history of other diseases of the musculoskeletal system and connective tissue: Secondary | ICD-10-CM | POA: Diagnosis not present

## 2017-07-14 DIAGNOSIS — F250 Schizoaffective disorder, bipolar type: Secondary | ICD-10-CM | POA: Diagnosis not present

## 2017-07-28 DIAGNOSIS — F250 Schizoaffective disorder, bipolar type: Secondary | ICD-10-CM | POA: Diagnosis not present

## 2017-07-31 DIAGNOSIS — M1711 Unilateral primary osteoarthritis, right knee: Secondary | ICD-10-CM | POA: Diagnosis not present

## 2017-08-06 DIAGNOSIS — F250 Schizoaffective disorder, bipolar type: Secondary | ICD-10-CM | POA: Diagnosis not present

## 2017-08-28 DIAGNOSIS — F250 Schizoaffective disorder, bipolar type: Secondary | ICD-10-CM | POA: Diagnosis not present

## 2017-08-28 DIAGNOSIS — Z23 Encounter for immunization: Secondary | ICD-10-CM | POA: Diagnosis not present

## 2017-11-25 DIAGNOSIS — F250 Schizoaffective disorder, bipolar type: Secondary | ICD-10-CM | POA: Diagnosis not present

## 2017-12-11 DIAGNOSIS — F250 Schizoaffective disorder, bipolar type: Secondary | ICD-10-CM | POA: Diagnosis not present

## 2018-01-08 DIAGNOSIS — F250 Schizoaffective disorder, bipolar type: Secondary | ICD-10-CM | POA: Diagnosis not present

## 2018-01-29 DIAGNOSIS — J019 Acute sinusitis, unspecified: Secondary | ICD-10-CM | POA: Diagnosis not present

## 2018-01-29 DIAGNOSIS — H6123 Impacted cerumen, bilateral: Secondary | ICD-10-CM | POA: Diagnosis not present

## 2018-02-08 DIAGNOSIS — F451 Undifferentiated somatoform disorder: Secondary | ICD-10-CM | POA: Diagnosis not present

## 2018-02-08 DIAGNOSIS — F250 Schizoaffective disorder, bipolar type: Secondary | ICD-10-CM | POA: Diagnosis not present

## 2018-02-22 DIAGNOSIS — I10 Essential (primary) hypertension: Secondary | ICD-10-CM | POA: Diagnosis not present

## 2018-02-22 DIAGNOSIS — R451 Restlessness and agitation: Secondary | ICD-10-CM | POA: Diagnosis not present

## 2018-02-22 DIAGNOSIS — R4182 Altered mental status, unspecified: Secondary | ICD-10-CM | POA: Diagnosis not present

## 2018-02-22 DIAGNOSIS — F29 Unspecified psychosis not due to a substance or known physiological condition: Secondary | ICD-10-CM | POA: Diagnosis not present

## 2018-02-23 DIAGNOSIS — F209 Schizophrenia, unspecified: Secondary | ICD-10-CM | POA: Diagnosis not present

## 2018-02-23 DIAGNOSIS — R4182 Altered mental status, unspecified: Secondary | ICD-10-CM | POA: Diagnosis not present

## 2018-02-23 DIAGNOSIS — F29 Unspecified psychosis not due to a substance or known physiological condition: Secondary | ICD-10-CM | POA: Diagnosis not present

## 2018-02-23 DIAGNOSIS — I10 Essential (primary) hypertension: Secondary | ICD-10-CM | POA: Diagnosis not present

## 2018-02-23 DIAGNOSIS — R451 Restlessness and agitation: Secondary | ICD-10-CM | POA: Diagnosis not present

## 2018-05-13 DIAGNOSIS — R072 Precordial pain: Secondary | ICD-10-CM | POA: Diagnosis not present

## 2018-05-13 DIAGNOSIS — H5500 Unspecified nystagmus: Secondary | ICD-10-CM | POA: Diagnosis not present

## 2018-05-13 DIAGNOSIS — E162 Hypoglycemia, unspecified: Secondary | ICD-10-CM | POA: Diagnosis not present

## 2018-05-13 DIAGNOSIS — K219 Gastro-esophageal reflux disease without esophagitis: Secondary | ICD-10-CM | POA: Diagnosis not present

## 2018-05-13 DIAGNOSIS — Z23 Encounter for immunization: Secondary | ICD-10-CM | POA: Diagnosis not present

## 2018-05-26 DIAGNOSIS — Z0000 Encounter for general adult medical examination without abnormal findings: Secondary | ICD-10-CM | POA: Diagnosis not present
